# Patient Record
Sex: Male | Born: 1960
Health system: Southern US, Community
[De-identification: ages and names within clinical notes are randomized; demographics above are authoritative.]

## PROBLEM LIST (undated history)

## (undated) DIAGNOSIS — Z87442 Personal history of urinary calculi: Secondary | ICD-10-CM

## (undated) DIAGNOSIS — R112 Nausea with vomiting, unspecified: Secondary | ICD-10-CM

## (undated) DIAGNOSIS — E785 Hyperlipidemia, unspecified: Secondary | ICD-10-CM

## (undated) DIAGNOSIS — Z973 Presence of spectacles and contact lenses: Secondary | ICD-10-CM

## (undated) DIAGNOSIS — T7840XA Allergy, unspecified, initial encounter: Secondary | ICD-10-CM

## (undated) DIAGNOSIS — R3915 Urgency of urination: Secondary | ICD-10-CM

## (undated) DIAGNOSIS — I10 Essential (primary) hypertension: Secondary | ICD-10-CM

## (undated) DIAGNOSIS — N529 Male erectile dysfunction, unspecified: Secondary | ICD-10-CM

## (undated) DIAGNOSIS — N200 Calculus of kidney: Secondary | ICD-10-CM

## (undated) DIAGNOSIS — Z9889 Other specified postprocedural states: Secondary | ICD-10-CM

## (undated) DIAGNOSIS — Z8619 Personal history of other infectious and parasitic diseases: Secondary | ICD-10-CM

## (undated) HISTORY — PX: NEUROPLASTY / TRANSPOSITION ULNAR NERVE AT ELBOW: SUR895

## (undated) HISTORY — PX: POLYPECTOMY: SHX149

## (undated) HISTORY — DX: Essential (primary) hypertension: I10

## (undated) HISTORY — DX: Allergy, unspecified, initial encounter: T78.40XA

## (undated) HISTORY — PX: EXTRACORPOREAL SHOCK WAVE LITHOTRIPSY: SHX1557

---
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
## 2001-12-05 ENCOUNTER — Emergency Department (HOSPITAL_COMMUNITY): Admission: EM | Admit: 2001-12-05 | Discharge: 2001-12-05 | Payer: Self-pay | Admitting: Emergency Medicine

## 2009-05-19 ENCOUNTER — Emergency Department: Payer: Self-pay | Admitting: Emergency Medicine

## 2012-01-03 ENCOUNTER — Other Ambulatory Visit: Payer: Self-pay | Admitting: Urology

## 2012-01-08 ENCOUNTER — Encounter (HOSPITAL_COMMUNITY): Payer: Self-pay | Admitting: *Deleted

## 2012-01-08 NOTE — Pre-Procedure Instructions (Signed)
Asked to bring blue folder the day of the procedure,insurance card,I.D. driver's license,wear comfortable clothing and have a driver for the day. Asked not to take Advil,Motrin,Ibuprofen,Aleve or any NSAIDS, Aspirin, or Toradol for 72 hours prior to procedure,  No vitamins or herbal medications 7 days prior to procedure. Will stop Aspirin 72 hours before procedure. Instructed to take laxative per doctor's office instructions and eat a light dinner the evening before procedure.   To arrive at 0530 for lithotripsy procedure.

## 2012-01-17 NOTE — H&P (Signed)
History of Present Illness            [name] returns for annual follow-up.    He has been doing extremely well with no signs or symptoms of recurrent stone disease.  The patient is known to have a small stone in the lower pole of his left kidney.  On KUB 1 year ago the stone appears to be unchanged in size and location, measuring just about 3-4 mm.  He was on AndroGel through his primary care physician but has stopped that about 6 months ago with only mild changes.  He has had some decrease in energy, but did not think it was worth the cost. His last PSA was 1 year ago and normal at 1.0.  No voiding complaints.  His urine today is crystal clear. He does report a concerning growth on his penis.   Past Medical History Problems  1. History of  Hypercholesterolemia 272.0 2. History of  Hypertension 401.9  Surgical History Problems  1. History of  Leg Repair Left 2. History of  Neuroplasty With Transposition Of Ulnar Nerve 3. History of  Surgery Of Male Genitalia Vasectomy V25.2  Current Meds 1. AndroGel Pump 20.25 MG/ACT (1.62%) Transdermal Gel; Therapy: 19Apr2012 to 2. Aspirin 81 MG Oral Tablet Delayed Release; Therapy: (Recorded:08Jul2010) to 3. CoQ10 CAPS; Therapy: (Recorded:08Jul2010) to 4. Fish Oil CAPS; Therapy: (Recorded:08Jul2010) to 5. Levitra 20 MG Oral Tablet; Therapy: 13Jun2011 to 6. Meloxicam 15 MG Oral Tablet; Therapy: 02Nov2010 to 7. Simvastatin TABS; 40mg  QD; Therapy: (Recorded:08Jul2010) to  Allergies Medication  1. No Known Drug Allergies  Family History Problems  1. Family history of  Family Health Status - Father's Age 66yrs 2. Family history of  Family Health Status - Mother's Age 38yrs 3. Family history of  Family Health Status Number Of Children 1 son and 2 daughters 4. Family history of  Prostate Cancer V16.42  Social History Problems  1. Being A Social Drinker 2. Caffeine Use 3 qd 3. Marital History - Currently Married 4.  Occupation: Pharmacist, community 5. Tobacco Use V15.82 1/2 ppd for 12yrs, nonsmoker for the past 3 yrs, but does continue w/ smokeless  Review of Systems Genitourinary, constitutional, skin, eye, otolaryngeal, hematologic/lymphatic, cardiovascular, pulmonary, endocrine, musculoskeletal, gastrointestinal, neurological and psychiatric system(s) were reviewed and pertinent findings if present are noted.  Genitourinary: nocturia, erectile dysfunction and penile lesion, but no testicular pain.  Gastrointestinal: heartburn, but no flank pain and no abdominal pain.    Vitals Vital Signs [Data Includes: Last 1 Day]  17Oct2013 09:34AM  Blood Pressure: 133 / 78 Temperature: 98.2 F Heart Rate: 78  Physical Exam Constitutional: Well nourished and well developed . No acute distress.  Neck: The appearance of the neck is normal and no neck mass is present.  Pulmonary: No respiratory distress and normal respiratory rhythm and effort.  Cardiovascular: Heart rate and rhythm are normal . No peripheral edema.  Abdomen: The abdomen is soft and nontender. No masses are palpated. No CVA tenderness. No hernias are palpable. No hepatosplenomegaly noted.  Rectal: Rectal exam demonstrates normal sphincter tone, no tenderness and no masses. Estimated prostate size is 1+. Normal rectal tone, no rectal masses, prostate is smooth, symmetric and non-tender. The prostate has no nodularity and is not tender. The left seminal vesicle is nonpalpable. The right seminal vesicle is nonpalpable. The perineum is normal on inspection.  Genitourinary: The penis is circumcised. Penile lesion: A single 5 mm papule(s) noted.    Results/Data   On KUB today  the patient's left renal stone appears denser and larger and now measuring 4-5 mm. I saw no other suspicious calcifications.     Assessment Assessed  1. Nephrolithiasis 592.0 2. 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 Hydrocodone-Acetaminophen 5-500 MG  Oral Tablet; 1-2 TABS PO Q 4-6H PRN PAIN; Therapy:  17Oct2013 to (Last Rx:17Oct2013) 2. Follow-up Schedule Surgery Office  Follow-up  Requested for: 17Oct2013  Discussion/Summary      .    1. Nephrolithiasis. Finnlee has had a 3-4 mm stone in the mid pole of his left kidney that had been observed. He has had no significant flank pain or hematuria. On KUB today, there are 2 significant findings: 1. This stone has increased in size and is now measuring approximately 5 mm x 7 mm. More importantly, the stone currently appears to be located in the renal pelvis/UPJ region, as opposed to a middle calyx, where it was. There is really no indication for CT imaging, since a KUB does define this quite nicely. This stone at this point should be treated, given its location. Ultimate treatment would be ESWL. We went over that procedure, success rates. We will attempt to get him on the schedule for sometime in the next several weeks. I will provide him a prescription for some pain medication in case this does become symptomatic. If that is the case, then something may need to be done on a more urgent basis.  2. Penile lesion. On clinical exam this appears to be an isolated solitary condyloma. We recommended a cryo procedure, which was done in the office today. We performed 2 freeze cycles for approximately 40 seconds and Bacitracin ointment was applied. We will keep an eye on these lesions. Additional information about condyloma will be provided to the patient.

## 2012-01-20 ENCOUNTER — Ambulatory Visit (HOSPITAL_COMMUNITY)
Admission: RE | Admit: 2012-01-20 | Discharge: 2012-01-20 | Disposition: A | Discharge status: Home / Self Care | Payer: 59 | Source: Ambulatory Visit | Attending: Urology | Admitting: Urology

## 2012-01-20 ENCOUNTER — Encounter (HOSPITAL_COMMUNITY): Payer: Self-pay

## 2012-01-20 ENCOUNTER — Ambulatory Visit (HOSPITAL_COMMUNITY): Payer: 59

## 2012-01-20 ENCOUNTER — Encounter (HOSPITAL_COMMUNITY)
Admission: RE | Disposition: A | Discharge status: Home / Self Care | Payer: Self-pay | Source: Ambulatory Visit | Attending: Urology

## 2012-01-20 DIAGNOSIS — N200 Calculus of kidney: Secondary | ICD-10-CM | POA: Insufficient documentation

## 2012-01-20 DIAGNOSIS — Z79899 Other long term (current) drug therapy: Secondary | ICD-10-CM | POA: Insufficient documentation

## 2012-01-20 DIAGNOSIS — F17200 Nicotine dependence, unspecified, uncomplicated: Secondary | ICD-10-CM | POA: Insufficient documentation

## 2012-01-20 DIAGNOSIS — E7800 Pure hypercholesterolemia, unspecified: Secondary | ICD-10-CM | POA: Insufficient documentation

## 2012-01-20 DIAGNOSIS — I10 Essential (primary) hypertension: Secondary | ICD-10-CM | POA: Insufficient documentation

## 2012-01-20 DIAGNOSIS — Z7982 Long term (current) use of aspirin: Secondary | ICD-10-CM | POA: Insufficient documentation

## 2012-01-20 SURGERY — LITHOTRIPSY, ESWL
Anesthesia: LOCAL | Laterality: Left

## 2012-01-20 MED ORDER — DEXTROSE-NACL 5-0.45 % IV SOLN
INTRAVENOUS | Status: DC
  Administered 2012-01-20: 07:00:00 via INTRAVENOUS

## 2012-01-20 MED ORDER — DIPHENHYDRAMINE HCL 25 MG PO CAPS
25.0000 mg | ORAL_CAPSULE | ORAL | Status: AC
  Administered 2012-01-20: 25 mg via ORAL
  Filled 2012-01-20: qty 1

## 2012-01-20 MED ORDER — DIAZEPAM 5 MG PO TABS
10.0000 mg | ORAL_TABLET | ORAL | Status: AC
  Administered 2012-01-20: 10 mg via ORAL
  Filled 2012-01-20: qty 2

## 2012-01-20 MED ORDER — CIPROFLOXACIN IN D5W 400 MG/200ML IV SOLN
400.0000 mg | INTRAVENOUS | Status: AC
  Administered 2012-01-20: 400 mg via INTRAVENOUS
  Filled 2012-01-20: qty 200

## 2012-01-20 NOTE — Op Note (Signed)
See [facility] OP note scanned into chart. 

## 2012-01-20 NOTE — Interval H&P Note (Signed)
History and Physical Interval Note:  01/20/2012 7:49 AM  Gabriel Clark  has presented today for surgery, with the diagnosis of Left Renal Calculus, Ureteral Pelvic Junction Stone  The various methods of treatment have been discussed with the patient and family. After consideration of risks, benefits and other options for treatment, the patient has consented to  Procedure(s) (LRB) with comments: EXTRACORPOREAL SHOCK WAVE LITHOTRIPSY (ESWL) (Left) as a surgical intervention .  The patient's history has been reviewed, patient examined, no change in status, stable for surgery.  I have reviewed the patient's chart and labs.  Questions were answered to the patient's satisfaction.     [name]

## 2013-12-17 ENCOUNTER — Other Ambulatory Visit: Payer: Self-pay | Admitting: Urology

## 2013-12-17 ENCOUNTER — Encounter (HOSPITAL_COMMUNITY): Payer: Self-pay | Admitting: *Deleted

## 2013-12-19 NOTE — H&P (Signed)
History of Present Illness [name] who is a patient of Dr. Isabel CapriceGrapey. He has a history of urolithiasis and presents today with a four-day history of severe left-sided flank pain and left upper quadrant pain. He has had significant nausea and vomiting but denies any objective fever. He denies any gross hematuria. Last fall, he did have a CT scan which confirmed a 5 mm left renal calculus along with other smaller left renal calculi. His last imaging was a KUB x-ray last December which demonstrated his left renal stone to be in stable position within the lower pole.   Past Medical History Problems  1. History of hypercholesterolemia (V12.29) 2. History of hypertension (V12.59)  Surgical History Problems  1. History of Leg Repair 2. History of Lithotripsy 3. History of Neuroplasty With Transposition Of Ulnar Nerve 4. History of Surgery Of Male Genitalia Vasectomy  Current Meds 1. Aspirin 81 MG Oral Tablet Delayed Release;  Therapy: (Recorded:08Jul2010) to Recorded 2. CoQ10 CAPS;  Therapy: (Recorded:08Jul2010) to Recorded 3. Fish Oil CAPS;  Therapy: (Recorded:08Jul2010) to Recorded 4. Levitra 20 MG Oral Tablet;  Therapy: 13Jun2011 to Recorded 5. Multi-Day Vitamins TABS;  Therapy: (Recorded:22Sep2014) to Recorded 6. Oxycodone-Acetaminophen 5-325 MG Oral Tablet;  Therapy: 04Nov2013 to Recorded 7. Simvastatin TABS; 40mg  QD;  Therapy: (Recorded:08Jul2010) to Recorded  Allergies Medication  1. No Known Drug Allergies  Family History Problems  1. Family history of Family Health Status Number Of Children   1 son and 2 daughters 2. FH: prostate cancer (V16.42)  Social History Problems  1. Being A Social Drinker 2. Current every day smoker (305.1) 3. Marital History - Currently Married 4. Occupation:   Pharmacist, communityproduction planner 5. Tobacco Use (V15.82)   1/2 ppd for 3346yrs, nonsmoker for the past 3 yrs, but does continue w/ smokeless  Review of  Systems  Genitourinary: no hematuria.  Gastrointestinal: nausea and vomiting.  Constitutional: no fever.    Vitals Vital Signs [Data Includes: Last 1 Day]  Recorded: 30Sep2015 10:43AM  Blood Pressure: 115 / 72 Temperature: 97.4 F Heart Rate: 71  Physical Exam Constitutional: Well nourished and well developed . No acute distress.  ENT:. The ears and nose are normal in appearance.  Neck: The appearance of the neck is normal and no neck mass is present.  Pulmonary: No respiratory distress, normal respiratory rhythm and effort and clear bilateral breath sounds.  Cardiovascular: Heart rate and rhythm are normal . No peripheral edema.  Abdomen: Moderate tenderness in the LUQ is present. moderate left CVA tenderness.  Skin: Normal skin turgor, no visible rash and no visible skin lesions.  Neuro/Psych:. Mood and affect are appropriate.    Results/Data Urine [Data Includes: Last 1 Day]   30Sep2015  COLOR AMBER   APPEARANCE CLEAR   SPECIFIC GRAVITY 1.025   pH 5.5   GLUCOSE NEG mg/dL  BILIRUBIN NEG   KETONE NEG mg/dL  BLOOD TRACE   PROTEIN 100 mg/dL  UROBILINOGEN 0.2 mg/dL  NITRITE NEG   LEUKOCYTE ESTERASE NEG   SQUAMOUS EPITHELIAL/HPF RARE   WBC 0-2 WBC/hpf  RBC 0-2 RBC/hpf  BACTERIA NONE SEEN   CRYSTALS NONE SEEN   CASTS NONE SEEN    A KUB x-ray was performed. I independently reviewed his KUB. This demonstrates a 5-6 mm calcification in the proximal left ureter consistent with his previously noted left renal calculus.   Assessment Assessed  1. Ureteral stone (592.1)  Plan Health Maintenance  1. UA With REFLEX; [Do Not Release]; Status:Complete;  Done: 30Sep2015 10:34AM Nephrolithiasis  2. KUB; Status:Resulted - Requires Verification;   Done: 30Sep2015 12:00AM Ureteral stone  3. Start: Ondansetron 4 MG Oral Tablet Dispersible; TAKE 4 MG Every 8 hours PRN nausea 4. Start: Oxycodone-Acetaminophen 5-325 MG Oral Tablet; Take 1 - 2 po q 4-6hours prn  pain 5. Start:  Tamsulosin HCl - 0.4 MG Oral Capsule; TAKE 1 CAPSULE AT BEDTIME 6. Follow-up Office  Follow-up - will call to schedule surgery  Status: Hold For - Date of  Service  Requested for: 30Sep2015  Discussion/Summary 1. Proximal left ureteral calculus: His symptoms do appear to be consistent with a proximal left ureteral calculus based on his KUB images today. I have reviewed options with him including a trial of medical expulsion therapy versus definitive treatment. Considering his pain symptoms, he would like to pursue with scheduling surgery in the near future and in the meantime make an attempt to try to pass a stone. He was provided a prescription for oxycodone/acetaminophen, tamsulosin, and Zofran. He understands that we would need to proceed with more urgent therapy should he develop fever, uncontrolled pain, or persistent nausea and vomiting that cannot be well controlled. I will plan to discuss his situation with Dr. Isabel CapriceGrapey. Dr. Isabel CapriceGrapey is unable to treat him in the near future, I told him that I do likely have some time next Monday and could treat him at that point. After reviewing treatment options including the pros and cons of shockwave lithotripsy and ureteroscopic laser lithotripsy, he does adamantly wish to proceed with left ureteroscopic laser lithotripsy for treatment. He has previously undergone shockwave lithotripsy and does wish to proceed with more definitive therapy at this time. We reviewed the potential risks, complications, and expected recovery process associated with this procedure. He gives his informed consent to proceed and will be scheduled appropriately.    Cc: Dr. Barron Alvineavid Grapey     Signatures Electronically signed by : Heloise PurpuraLester Emanuelle Bastos, M.D.; Dec 15 2013 12:40PM EST

## 2013-12-20 ENCOUNTER — Encounter (HOSPITAL_COMMUNITY): Payer: 59 | Admitting: Anesthesiology

## 2013-12-20 ENCOUNTER — Encounter (HOSPITAL_COMMUNITY)
Admission: RE | Disposition: A | Discharge status: Home / Self Care | Payer: Self-pay | Source: Ambulatory Visit | Attending: Urology

## 2013-12-20 ENCOUNTER — Ambulatory Visit (HOSPITAL_COMMUNITY): Payer: 59 | Admitting: Anesthesiology

## 2013-12-20 ENCOUNTER — Ambulatory Visit (HOSPITAL_COMMUNITY)
Admission: RE | Admit: 2013-12-20 | Discharge: 2013-12-20 | Disposition: A | Discharge status: Home / Self Care | Payer: 59 | Source: Ambulatory Visit | Attending: Urology | Admitting: Urology

## 2013-12-20 ENCOUNTER — Encounter (HOSPITAL_COMMUNITY): Payer: Self-pay | Admitting: *Deleted

## 2013-12-20 ENCOUNTER — Ambulatory Visit (HOSPITAL_COMMUNITY): Payer: 59

## 2013-12-20 DIAGNOSIS — N201 Calculus of ureter: Secondary | ICD-10-CM | POA: Insufficient documentation

## 2013-12-20 DIAGNOSIS — E780 Pure hypercholesterolemia: Secondary | ICD-10-CM | POA: Diagnosis not present

## 2013-12-20 DIAGNOSIS — F17210 Nicotine dependence, cigarettes, uncomplicated: Secondary | ICD-10-CM | POA: Diagnosis not present

## 2013-12-20 DIAGNOSIS — I10 Essential (primary) hypertension: Secondary | ICD-10-CM | POA: Insufficient documentation

## 2013-12-20 HISTORY — PX: CYSTOSCOPY WITH RETROGRADE PYELOGRAM, URETEROSCOPY AND STENT PLACEMENT: SHX5789

## 2013-12-20 LAB — CBC
HEMATOCRIT: 35.1 % — AB (ref 39.0–52.0)
Hemoglobin: 12.5 g/dL — ABNORMAL LOW (ref 13.0–17.0)
MCH: 32.6 pg (ref 26.0–34.0)
MCHC: 35.6 g/dL (ref 30.0–36.0)
MCV: 91.4 fL (ref 78.0–100.0)
PLATELETS: 192 10*3/uL (ref 150–400)
RBC: 3.84 MIL/uL — AB (ref 4.22–5.81)
RDW: 11.3 % — AB (ref 11.5–15.5)
WBC: 7.7 10*3/uL (ref 4.0–10.5)

## 2013-12-20 LAB — BASIC METABOLIC PANEL
ANION GAP: 13 (ref 5–15)
BUN: 25 mg/dL — AB (ref 6–23)
CHLORIDE: 98 meq/L (ref 96–112)
CO2: 26 mEq/L (ref 19–32)
Calcium: 8.9 mg/dL (ref 8.4–10.5)
Creatinine, Ser: 1.87 mg/dL — ABNORMAL HIGH (ref 0.50–1.35)
GFR calc non Af Amer: 39 mL/min — ABNORMAL LOW (ref >90)
GFR, EST AFRICAN AMERICAN: 45 mL/min — AB (ref >90)
Glucose, Bld: 104 mg/dL — ABNORMAL HIGH (ref 70–99)
POTASSIUM: 4.2 meq/L (ref 3.7–5.3)
Sodium: 137 mEq/L (ref 137–147)

## 2013-12-20 SURGERY — CYSTOURETEROSCOPY, WITH RETROGRADE PYELOGRAM AND STENT INSERTION
Anesthesia: General | Laterality: Left

## 2013-12-20 MED ORDER — SODIUM CHLORIDE 0.9 % IJ SOLN
INTRAMUSCULAR | Status: AC
  Filled 2013-12-20: qty 10

## 2013-12-20 MED ORDER — PROPOFOL 10 MG/ML IV BOLUS
INTRAVENOUS | Status: DC | PRN
  Administered 2013-12-20: 200 mg via INTRAVENOUS

## 2013-12-20 MED ORDER — ONDANSETRON HCL 4 MG/2ML IJ SOLN
INTRAMUSCULAR | Status: DC | PRN
  Administered 2013-12-20: 4 mg via INTRAVENOUS

## 2013-12-20 MED ORDER — CIPROFLOXACIN IN D5W 400 MG/200ML IV SOLN
400.0000 mg | INTRAVENOUS | Status: AC
  Administered 2013-12-20: 400 mg via INTRAVENOUS

## 2013-12-20 MED ORDER — MIDAZOLAM HCL 2 MG/2ML IJ SOLN
INTRAMUSCULAR | Status: AC
  Filled 2013-12-20: qty 2

## 2013-12-20 MED ORDER — FENTANYL CITRATE 0.05 MG/ML IJ SOLN
INTRAMUSCULAR | Status: DC | PRN
  Administered 2013-12-20 (×2): 50 ug via INTRAVENOUS

## 2013-12-20 MED ORDER — TAMSULOSIN HCL 0.4 MG PO CAPS: 0.4000 mg | ORAL_CAPSULE | Freq: Every day | ORAL | Status: DC

## 2013-12-20 MED ORDER — SODIUM CHLORIDE 0.9 % IV SOLN
INTRAVENOUS | Status: DC
  Administered 2013-12-20: 1000 mL via INTRAVENOUS

## 2013-12-20 MED ORDER — LIDOCAINE HCL 2 % EX GEL
CUTANEOUS | Status: AC
  Filled 2013-12-20: qty 10

## 2013-12-20 MED ORDER — MIDAZOLAM HCL 5 MG/5ML IJ SOLN
INTRAMUSCULAR | Status: DC | PRN
  Administered 2013-12-20: 1 mg via INTRAVENOUS

## 2013-12-20 MED ORDER — LIDOCAINE HCL 2 % EX GEL
CUTANEOUS | Status: DC | PRN
  Administered 2013-12-20: 1 via URETHRAL

## 2013-12-20 MED ORDER — LACTATED RINGERS IV SOLN: INTRAVENOUS | Status: DC

## 2013-12-20 MED ORDER — SODIUM CHLORIDE 0.9 % IR SOLN
Status: DC | PRN
  Administered 2013-12-20: 4000 mL

## 2013-12-20 MED ORDER — ONDANSETRON HCL 4 MG/2ML IJ SOLN
INTRAMUSCULAR | Status: AC
  Filled 2013-12-20: qty 2

## 2013-12-20 MED ORDER — LIDOCAINE HCL 1 % IJ SOLN
INTRAMUSCULAR | Status: DC | PRN
  Administered 2013-12-20: 40 mg via INTRADERMAL

## 2013-12-20 MED ORDER — FENTANYL CITRATE 0.05 MG/ML IJ SOLN
25.0000 ug | INTRAMUSCULAR | Status: DC | PRN
  Administered 2013-12-20 (×2): 50 ug via INTRAVENOUS

## 2013-12-20 MED ORDER — CIPROFLOXACIN HCL 500 MG PO TABS: 500.0000 mg | ORAL_TABLET | Freq: Two times a day (BID) | ORAL | Status: DC

## 2013-12-20 MED ORDER — LIDOCAINE HCL (CARDIAC) 20 MG/ML IV SOLN
INTRAVENOUS | Status: AC
  Filled 2013-12-20: qty 5

## 2013-12-20 MED ORDER — FENTANYL CITRATE 0.05 MG/ML IJ SOLN
INTRAMUSCULAR | Status: AC
  Filled 2013-12-20: qty 5

## 2013-12-20 MED ORDER — CIPROFLOXACIN IN D5W 400 MG/200ML IV SOLN
INTRAVENOUS | Status: AC
  Filled 2013-12-20: qty 200

## 2013-12-20 MED ORDER — FENTANYL CITRATE 0.05 MG/ML IJ SOLN
INTRAMUSCULAR | Status: AC
  Filled 2013-12-20: qty 2

## 2013-12-20 MED ORDER — HYDROCODONE-ACETAMINOPHEN 5-325 MG PO TABS: 1.0000 | ORAL_TABLET | Freq: Four times a day (QID) | ORAL | Status: DC | PRN

## 2013-12-20 MED ORDER — 0.9 % SODIUM CHLORIDE (POUR BTL) OPTIME
TOPICAL | Status: DC | PRN
  Administered 2013-12-20: 1000 mL

## 2013-12-20 MED ORDER — IOHEXOL 300 MG/ML  SOLN
INTRAMUSCULAR | Status: DC | PRN
  Administered 2013-12-20: 10 mL

## 2013-12-20 MED ORDER — PROPOFOL 10 MG/ML IV BOLUS
INTRAVENOUS | Status: AC
  Filled 2013-12-20: qty 20

## 2013-12-20 SURGICAL SUPPLY — 20 items
BAG URO CATCHER STRL LF (DRAPE) ×3 IMPLANT
BASKET ZERO TIP NITINOL 2.4FR (BASKET) IMPLANT
CATH INTERMIT  6FR 70CM (CATHETERS) ×3 IMPLANT
CLOTH BEACON ORANGE TIMEOUT ST (SAFETY) ×3 IMPLANT
DRAPE CAMERA CLOSED 9X96 (DRAPES) ×3 IMPLANT
FIBER LASER FLEXIVA 1000 (UROLOGICAL SUPPLIES) IMPLANT
FIBER LASER FLEXIVA 200 (UROLOGICAL SUPPLIES) IMPLANT
FIBER LASER FLEXIVA 365 (UROLOGICAL SUPPLIES) IMPLANT
FIBER LASER FLEXIVA 550 (UROLOGICAL SUPPLIES) IMPLANT
FIBER LASER TRAC TIP (UROLOGICAL SUPPLIES) IMPLANT
GLOVE BIOGEL M STRL SZ7.5 (GLOVE) ×3 IMPLANT
GOWN STRL REUS W/TWL LRG LVL3 (GOWN DISPOSABLE) ×3 IMPLANT
GUIDEWIRE ANG ZIPWIRE 038X150 (WIRE) IMPLANT
GUIDEWIRE STR DUAL SENSOR (WIRE) ×6 IMPLANT
MANIFOLD NEPTUNE II (INSTRUMENTS) ×3 IMPLANT
PACK CYSTO (CUSTOM PROCEDURE TRAY) ×3 IMPLANT
SHEATH ACCESS URETERAL 38CM (SHEATH) ×3 IMPLANT
STENT CONTOUR 6FRX26X.038 (STENTS) ×3 IMPLANT
TUBING CONNECTING 10 (TUBING) ×2 IMPLANT
TUBING CONNECTING 10' (TUBING) ×1

## 2013-12-20 NOTE — Op Note (Signed)
Preoperative diagnosis: Left proximal ureteral calculus  Postoperative diagnosis: Left proximla ureteral calculus  Procedure:  1. Cystoscopy 2. Left ureteroscopy 3. Left ureteral stent placement (6 x 26 - no string) 4. Left retrograde pyelography with interpretation  Surgeon: Pryor Curia. M.D.  Anesthesia: General  Complications: None  Intraoperative findings: Left retrograde pyelography demonstrated a filling defect within the proximal left ureter consistent with the patient's known calculus without other abnormalities.  EBL: Minimal  Indication: Gabriel Clark is a 53 y.o. year old patient with urolithiasis and a known 5-6 mm left ureteral stone. After reviewing the management options for treatment, the patient elected to proceed with the above surgical procedure(s). We have discussed the potential benefits and risks of the procedure, side effects of the proposed treatment, the likelihood of the patient achieving the goals of the procedure, and any potential problems that might occur during the procedure or recuperation. Informed consent has been obtained.  Description of procedure:  The patient was taken to the operating room and general anesthesia was induced.  The patient was placed in the dorsal lithotomy position, prepped and draped in the usual sterile fashion, and preoperative antibiotics were administered. A preoperative time-out was performed.   Cystourethroscopy was performed.  The patient's urethra was examined and was normal. The bladder was then systematically examined in its entirety. There was no evidence for any bladder tumors, stones, or other mucosal pathology.    Attention then turned to the left ureteral orifice and a ureteral catheter was used to intubate the ureteral orifice.  Omnipaque contrast was injected through the ureteral catheter and a retrograde pyelogram was performed with findings as dictated above.  A 0.38 sensor guidewire was then advanced  up the left ureter into the renal pelvis under fluoroscopic guidance. The 6 Fr semirigid ureteroscope was then advanced into the ureter next to the guidewire and the entire ureter was examined.  There were multiple areas that were tortuous and required a 2nd wire to navigate the ureter.  The stone appeared to have migrated proximally above the level that was able to visualized with the semirigid scope.  This scope was therefore removed and an attempt was made to place a 12/14 ureteral access sheath.  However, the access sheath was unable to be safely placed up the ureter with resistance met in the distal mid ureter.  An attempt to then place the inner sheath alone for dilation was also unsuccessful.  It was therefore decided that the safest course of action would be to place a ureteral stent to allow for passive dilation of the ureter.  The wire was then backloaded through the cystoscope and a ureteral stent was advance over the wire using Seldinger technique.  The stent was positioned appropriately under fluoroscopic and cystoscopic guidance.  The wire was then removed with an adequate stent curl noted in the renal pelvis as well as in the bladder.  The bladder was then emptied and the procedure ended.  The patient appeared to tolerate the procedure well and without complications.  The patient was able to be awakened and transferred to the recovery unit in satisfactory condition.

## 2013-12-20 NOTE — Discharge Instructions (Signed)

## 2013-12-20 NOTE — Transfer of Care (Signed)
Immediate Anesthesia Transfer of Care Note  Patient: Gabriel Clark  Procedure(s) Performed: Procedure(s): CYSTOSCOPY WITH RETROGRADE PYELOGRAM, URETEROSCOPY AND STENT PLACEMENT (Left)  Patient Location: PACU  Anesthesia Type:General  Level of Consciousness: awake, alert  and oriented  Airway & Oxygen Therapy: Patient Spontanous Breathing and Patient connected to face mask oxygen  Post-op Assessment: Report given to PACU RN and Post -op Vital signs reviewed and stable  Post vital signs: Reviewed and stable  Complications: No apparent anesthesia complications

## 2013-12-20 NOTE — Anesthesia Preprocedure Evaluation (Addendum)
Anesthesia Evaluation  Patient identified by MRN, date of birth, ID band Patient awake    Reviewed: Allergy & Precautions, H&P , NPO status , Patient's Chart, lab work & pertinent test results, reviewed documented beta blocker date and time   Airway Mallampati: II TM Distance: >3 FB Neck ROM: full    Dental no notable dental hx. (+) Teeth Intact, Dental Advisory Given   Pulmonary neg pulmonary ROS, Current Smoker,  breath sounds clear to auscultation  Pulmonary exam normal       Cardiovascular Exercise Tolerance: Good negative cardio ROS  Rhythm:regular Rate:Normal     Neuro/Psych negative neurological ROS  negative psych ROS   GI/Hepatic negative GI ROS, Neg liver ROS,   Endo/Other  negative endocrine ROS  Renal/GU negative Renal ROS  negative genitourinary   Musculoskeletal   Abdominal   Peds  Hematology negative hematology ROS (+)   Anesthesia Other Findings   Reproductive/Obstetrics negative OB ROS                          Anesthesia Physical Anesthesia Plan  ASA: II  Anesthesia Plan: General   Post-op Pain Management:    Induction: Intravenous  Airway Management Planned: LMA  Additional Equipment:   Intra-op Plan:   Post-operative Plan:   Informed Consent: I have reviewed the patients History and Physical, chart, labs and discussed the procedure including the risks, benefits and alternatives for the proposed anesthesia with the patient or authorized representative who has indicated his/her understanding and acceptance.   Dental Advisory Given  Plan Discussed with: CRNA and Surgeon  Anesthesia Plan Comments:         Anesthesia Quick Evaluation

## 2013-12-20 NOTE — Interval H&P Note (Signed)
History and Physical Interval Note:  12/20/2013 1:30 PM  Gabriel Clark  has presented today for surgery, with the diagnosis of LEFT URETERAL CALCULUS  The various methods of treatment have been discussed with the patient and family. After consideration of risks, benefits and other options for treatment, the patient has consented to  Procedure(s): CYSTOSCOPY WITH RETROGRADE PYELOGRAM, URETEROSCOPY AND STENT PLACEMENT (Left) HOLMIUM LASER APPLICATION (Left) as a surgical intervention .  The patient's history has been reviewed, patient examined, no change in status, stable for surgery.  I have reviewed the patient's chart and labs.  Questions were answered to the patient's satisfaction.     Rashae Rother,LES

## 2013-12-20 NOTE — Anesthesia Postprocedure Evaluation (Signed)
  Anesthesia Post-op Note  Patient: Gabriel Clark  Procedure(s) Performed: Procedure(s) (LRB): CYSTOSCOPY WITH RETROGRADE PYELOGRAM, URETEROSCOPY AND STENT PLACEMENT (Left)  Patient Location: PACU  Anesthesia Type: General  Level of Consciousness: awake and alert   Airway and Oxygen Therapy: Patient Spontanous Breathing  Post-op Pain: mild  Post-op Assessment: Post-op Vital signs reviewed, Patient's Cardiovascular Status Stable, Respiratory Function Stable, Patent Airway and No signs of Nausea or vomiting  Last Vitals:  Filed Vitals:   12/20/13 1539  BP: 112/68  Pulse: 57  Temp: 36.6 C  Resp: 12    Post-op Vital Signs: stable   Complications: No apparent anesthesia complications

## 2013-12-21 ENCOUNTER — Encounter (HOSPITAL_COMMUNITY): Payer: Self-pay | Admitting: Urology

## 2013-12-21 ENCOUNTER — Other Ambulatory Visit: Payer: Self-pay | Admitting: Urology

## 2014-01-03 ENCOUNTER — Encounter (HOSPITAL_COMMUNITY): Payer: Self-pay | Admitting: Pharmacy Technician

## 2014-01-03 ENCOUNTER — Encounter (HOSPITAL_COMMUNITY)
Admission: RE | Admit: 2014-01-03 | Discharge: 2014-01-03 | Disposition: A | Discharge status: Home / Self Care | Payer: 59 | Source: Ambulatory Visit | Attending: Urology | Admitting: Urology

## 2014-01-03 ENCOUNTER — Encounter (HOSPITAL_COMMUNITY): Payer: Self-pay

## 2014-01-03 DIAGNOSIS — E780 Pure hypercholesterolemia: Secondary | ICD-10-CM | POA: Diagnosis not present

## 2014-01-03 DIAGNOSIS — F17210 Nicotine dependence, cigarettes, uncomplicated: Secondary | ICD-10-CM | POA: Diagnosis not present

## 2014-01-03 DIAGNOSIS — I10 Essential (primary) hypertension: Secondary | ICD-10-CM | POA: Diagnosis not present

## 2014-01-03 DIAGNOSIS — N202 Calculus of kidney with calculus of ureter: Secondary | ICD-10-CM | POA: Diagnosis not present

## 2014-01-03 HISTORY — DX: Personal history of urinary calculi: Z87.442

## 2014-01-03 HISTORY — DX: Other specified postprocedural states: R11.2

## 2014-01-03 HISTORY — DX: Other specified postprocedural states: Z98.890

## 2014-01-03 LAB — BASIC METABOLIC PANEL
ANION GAP: 9 (ref 5–15)
BUN: 24 mg/dL — ABNORMAL HIGH (ref 6–23)
CO2: 27 mEq/L (ref 19–32)
Calcium: 9.3 mg/dL (ref 8.4–10.5)
Chloride: 103 mEq/L (ref 96–112)
Creatinine, Ser: 1.06 mg/dL (ref 0.50–1.35)
GFR calc Af Amer: >90 mL/min (ref >90)
GFR, EST NON AFRICAN AMERICAN: 78 mL/min — AB (ref >90)
Glucose, Bld: 107 mg/dL — ABNORMAL HIGH (ref 70–99)
Potassium: 5 mEq/L (ref 3.7–5.3)
SODIUM: 139 meq/L (ref 137–147)

## 2014-01-03 LAB — CBC
HCT: 39.4 % (ref 39.0–52.0)
Hemoglobin: 13.8 g/dL (ref 13.0–17.0)
MCH: 33.6 pg (ref 26.0–34.0)
MCHC: 35 g/dL (ref 30.0–36.0)
MCV: 95.9 fL (ref 78.0–100.0)
PLATELETS: 193 10*3/uL (ref 150–400)
RBC: 4.11 MIL/uL — AB (ref 4.22–5.81)
RDW: 11.9 % (ref 11.5–15.5)
WBC: 5.9 10*3/uL (ref 4.0–10.5)

## 2014-01-03 NOTE — Patient Instructions (Addendum)
[facility]  ON:  Thursday  10/22  REPORT TO  SHORT STAY CENTER AT:  10:30 AM     DO NOT EAT  ANYTHING AFTER MIDNIGHT THE NIGHT BEFORE YOUR SURGERY.   NO FOOD, NO CHEWING GUM, NO MINTS, NO CANDIES, NO CHEWING TOBACCO. YOU MAY HAVE CLEAR LIQUIDS TO DRINK FROM MIDNIGHT THE NIGHT BEFORE SURGERY - UNTIL 6:30 AM DAY OF SURGERY - LIKE WATER, COFFEE ( JUST NO MILK OR CREAM ).   NOTHING TO DRINK AFTER 6:30 AM DAY OF YOUR SURGERY.  PLEASE TAKE THE FOLLOWING MEDICATIONS THE AM OF YOUR SURGERY WITH A FEW SIPS OF WATER:  NO MEDS TO TAKE - UNLESS YOU NEED A PAIN PILL.  .  DO NOT BRING VALUABLES, MONEY, CREDIT CARDS.  DO NOT WEAR JEWELRY, MAKE-UP, NAIL POLISH AND NO METAL PINS OR CLIPS IN YOUR HAIR. CONTACT LENS, DENTURES / PARTIALS, GLASSES SHOULD NOT BE WORN TO SURGERY AND IN MOST CASES-HEARING AIDS WILL NEED TO BE REMOVED.  BRING YOUR GLASSES CASE, ANY EQUIPMENT NEEDED FOR YOUR CONTACT LENS. FOR PATIENTS ADMITTED TO THE HOSPITAL--CHECK OUT TIME THE DAY OF DISCHARGE IS 11:00 AM.  ALL INPATIENT ROOMS ARE PRIVATE - WITH BATHROOM, TELEPHONE, TELEVISION AND WIFI INTERNET.  IF YOU ARE BEING DISCHARGED THE SAME DAY OF YOUR SURGERY--YOU CAN NOT DRIVE YOURSELF HOME--AND SHOULD NOT GO HOME ALONE BY TAXI OR BUS.  NO DRIVING OR OPERATING MACHINERY, OR MAKING LEGAL DECISIONS FOR 24 HOURS FOLLOWING ANESTHESIA / PAIN MEDICATIONS.  PLEASE MAKE ARRANGEMENTS FOR SOMEONE TO BE WITH YOU AT HOME THE FIRST 24 HOURS AFTER SURGERY. RESPONSIBLE DRIVER'S NAME / PHONE  PT'S WIFE WILL BE WITH HIM                                                     PLEASE BE AWARE THAT YOU MAY NEED ADDITIONAL BLOOD DRAWN DAY OF YOUR SURGERY  _______________________________________________________________________   [facility] Before surgery, you can play an important role.  Because skin is not sterile, your skin needs to be as free of germs as possible.  You can reduce the  number of germs on your skin by washing with CHG (chlorahexidine gluconate) soap before surgery.  CHG is an antiseptic cleaner which kills germs and bonds with the skin to continue killing germs even after washing. Please DO NOT use if you have an allergy to CHG or antibacterial soaps.  If your skin becomes reddened/irritated stop using the CHG and inform your nurse when you arrive at Short Stay. Do not shave (including legs and underarms) for at least 48 hours prior to the first CHG shower.  You may shave your face/neck. Please follow these instructions carefully:  1.  Shower with CHG Soap the night before surgery and the  morning of Surgery.  2.  If you choose to wash your hair, wash your hair first as usual with your  normal  shampoo.  3.  After you shampoo, rinse your hair and body thoroughly to remove the  shampoo.                           4.  Use CHG as you would any other liquid soap.  You can apply chg directly  to the  skin and wash                       Gently with a scrungie or clean washcloth.  5.  Apply the CHG Soap to your body ONLY FROM THE NECK DOWN.   Do not use on face/ open                           Wound or open sores. Avoid contact with eyes, ears mouth and genitals (private parts).                       Wash face,  Genitals (private parts) with your normal soap.             6.  Wash thoroughly, paying special attention to the area where your surgery  will be performed.  7.  Thoroughly rinse your body with warm water from the neck down.  8.  DO NOT shower/wash with your normal soap after using and rinsing off  the CHG Soap.                9.  Pat yourself dry with a clean towel.            10.  Wear clean pajamas.            11.  Place clean sheets on your bed the night of your first shower and do not  sleep with pets. Day of Surgery : Do not apply any lotions/deodorants the morning of surgery.  Please wear clean clothes to the hospital/surgery center.  FAILURE TO FOLLOW  THESE INSTRUCTIONS MAY RESULT IN THE CANCELLATION OF YOUR SURGERY PATIENT SIGNATURE_________________________________  NURSE SIGNATURE__________________________________  ________________________________________________________________________

## 2014-01-03 NOTE — Pre-Procedure Instructions (Signed)
EKG AND CXR REPORTS ARE IN EPIC FROM 12/20/13.

## 2014-01-05 NOTE — H&P (Signed)
History of Present Illness Gabriel Clark is a 53 year old gentleman who is a patient of Dr. Isabel CapriceGrapey. He has a history of urolithiasis and presents today with a four-day history of severe left-sided flank pain and left upper quadrant pain. He has had significant nausea and vomiting but denies any objective fever. He denies any gross hematuria. Last fall, he did have a CT scan which confirmed a 5 mm left renal calculus along with other smaller left renal calculi. His last imaging was a KUB x-ray last December which demonstrated his left renal stone to be in stable position within the lower pole.   Past Medical History Problems  1. History of hypercholesterolemia (V12.29) 2. History of hypertension (V12.59)  Surgical History Problems  1. History of Leg Repair 2. History of Lithotripsy 3. History of Neuroplasty With Transposition Of Ulnar Nerve 4. History of Surgery Of Male Genitalia Vasectomy  Current Meds 1. Aspirin 81 MG Oral Tablet Delayed Release;  Therapy: (Recorded:08Jul2010) to Recorded 2. CoQ10 CAPS;  Therapy: (Recorded:08Jul2010) to Recorded 3. Fish Oil CAPS;  Therapy: (Recorded:08Jul2010) to Recorded 4. Levitra 20 MG Oral Tablet;  Therapy: 13Jun2011 to Recorded 5. Multi-Day Vitamins TABS;  Therapy: (Recorded:22Sep2014) to Recorded 6. Oxycodone-Acetaminophen 5-325 MG Oral Tablet;  Therapy: 04Nov2013 to Recorded 7. Simvastatin TABS; 40mg  QD;  Therapy: (Recorded:08Jul2010) to Recorded  Allergies Medication  1. No Known Drug Allergies  Family History Problems  1. Family history of Family Health Status Number Of Children   1 son and 2 daughters 2. FH: prostate cancer (V16.42)  Social History Problems  1. Being A Social Drinker 2. Current every day smoker (305.1) 3. Marital History - Currently Married 4. Occupation:   Pharmacist, communityproduction planner 5. Tobacco Use (V15.82)   1/2 ppd for 3346yrs, nonsmoker for the past 3 yrs, but does continue w/ smokeless  Review of  Systems  Genitourinary: no hematuria.  Gastrointestinal: nausea and vomiting.  Constitutional: no fever.    Vitals Vital Signs [Data Includes: Last 1 Day]  Recorded: 30Sep2015 10:43AM  Blood Pressure: 115 / 72 Temperature: 97.4 F Heart Rate: 71  Physical Exam Constitutional: Well nourished and well developed . No acute distress.  ENT:. The ears and nose are normal in appearance.  Neck: The appearance of the neck is normal and no neck mass is present.  Pulmonary: No respiratory distress, normal respiratory rhythm and effort and clear bilateral breath sounds.  Cardiovascular: Heart rate and rhythm are normal . No peripheral edema.  Abdomen: Moderate tenderness in the LUQ is present. moderate left CVA tenderness.  Skin: Normal skin turgor, no visible rash and no visible skin lesions.  Neuro/Psych:. Mood and affect are appropriate.    Results/Data Urine [Data Includes: Last 1 Day]   30Sep2015  COLOR AMBER   APPEARANCE CLEAR   SPECIFIC GRAVITY 1.025   pH 5.5   GLUCOSE NEG mg/dL  BILIRUBIN NEG   KETONE NEG mg/dL  BLOOD TRACE   PROTEIN 100 mg/dL  UROBILINOGEN 0.2 mg/dL  NITRITE NEG   LEUKOCYTE ESTERASE NEG   SQUAMOUS EPITHELIAL/HPF RARE   WBC 0-2 WBC/hpf  RBC 0-2 RBC/hpf  BACTERIA NONE SEEN   CRYSTALS NONE SEEN   CASTS NONE SEEN    A KUB x-ray was performed. I independently reviewed his KUB. This demonstrates a 5-6 mm calcification in the proximal left ureter consistent with his previously noted left renal calculus.   Assessment Assessed  1. Ureteral stone (592.1)  Plan Health Maintenance  1. UA With REFLEX; [Do Not Release]; Status:Complete;  Done: 30Sep2015 10:34AM Nephrolithiasis  2. KUB; Status:Resulted - Requires Verification;   Done: 30Sep2015 12:00AM Ureteral stone  3. Start: Ondansetron 4 MG Oral Tablet Dispersible; TAKE 4 MG Every 8 hours PRN nausea 4. Start: Oxycodone-Acetaminophen 5-325 MG Oral Tablet; Take 1 - 2 po q 4-6hours prn  pain 5. Start:  Tamsulosin HCl - 0.4 MG Oral Capsule; TAKE 1 CAPSULE AT BEDTIME 6. Follow-up Office  Follow-up - will call to schedule surgery  Status: Hold For - Date of  Service  Requested for: 30Sep2015  Discussion/Summary 1. Proximal left ureteral calculus: His symptoms do appear to be consistent with a proximal left ureteral calculus based on his KUB images today. I have reviewed options with him including a trial of medical expulsion therapy versus definitive treatment. Considering his pain symptoms, he would like to pursue with scheduling surgery in the near future and in the meantime make an attempt to try to pass a stone. He was provided a prescription for oxycodone/acetaminophen, tamsulosin, and Zofran. He understands that we would need to proceed with more urgent therapy should he develop fever, uncontrolled pain, or persistent nausea and vomiting that cannot be well controlled. I will plan to discuss his situation with Dr. Isabel CapriceGrapey. Dr. Isabel CapriceGrapey is unable to treat him in the near future, I told him that I do likely have some time next Monday and could treat him at that point. After reviewing treatment options including the pros and cons of shockwave lithotripsy and ureteroscopic laser lithotripsy, he does adamantly wish to proceed with left ureteroscopic laser lithotripsy for treatment. He has previously undergone shockwave lithotripsy and does wish to proceed with more definitive therapy at this time. We reviewed the potential risks, complications, and expected recovery process associated with this procedure. He gives his informed consent to proceed and will be scheduled appropriately.   Procedure Cystoscopy with left retrograde pyelogram and attempted flexible ureteroscopy. Ureteroscope could not be passed into the proximal ureter nor could an access sheath be passed. He underwent ureteral stent placement and will plan to proceed with definitive therapy after passive stent dilation.   Signatures Electronically  signed by : Heloise PurpuraLester Colie Fugitt, M.D.; Dec 20 2013  4:34PM EST

## 2014-01-06 ENCOUNTER — Encounter (HOSPITAL_COMMUNITY): Payer: 59 | Admitting: Anesthesiology

## 2014-01-06 ENCOUNTER — Encounter (HOSPITAL_COMMUNITY)
Admission: RE | Disposition: A | Discharge status: Home / Self Care | Payer: Self-pay | Source: Ambulatory Visit | Attending: Urology

## 2014-01-06 ENCOUNTER — Ambulatory Visit (HOSPITAL_COMMUNITY)
Admission: RE | Admit: 2014-01-06 | Discharge: 2014-01-06 | Disposition: A | Discharge status: Home / Self Care | Payer: 59 | Source: Ambulatory Visit | Attending: Urology | Admitting: Urology

## 2014-01-06 ENCOUNTER — Ambulatory Visit (HOSPITAL_COMMUNITY): Payer: 59 | Admitting: Anesthesiology

## 2014-01-06 ENCOUNTER — Encounter (HOSPITAL_COMMUNITY): Payer: Self-pay | Admitting: *Deleted

## 2014-01-06 DIAGNOSIS — N202 Calculus of kidney with calculus of ureter: Secondary | ICD-10-CM | POA: Insufficient documentation

## 2014-01-06 DIAGNOSIS — E780 Pure hypercholesterolemia: Secondary | ICD-10-CM | POA: Insufficient documentation

## 2014-01-06 DIAGNOSIS — I10 Essential (primary) hypertension: Secondary | ICD-10-CM | POA: Insufficient documentation

## 2014-01-06 DIAGNOSIS — F17210 Nicotine dependence, cigarettes, uncomplicated: Secondary | ICD-10-CM | POA: Insufficient documentation

## 2014-01-06 HISTORY — PX: CYSTOSCOPY/RETROGRADE/URETEROSCOPY/STONE EXTRACTION WITH BASKET: SHX5317

## 2014-01-06 SURGERY — CYSTOSCOPY, WITH CALCULUS REMOVAL USING BASKET
Anesthesia: General | Laterality: Left

## 2014-01-06 MED ORDER — SODIUM CHLORIDE 0.9 % IJ SOLN
INTRAMUSCULAR | Status: AC
  Filled 2014-01-06: qty 10

## 2014-01-06 MED ORDER — LIDOCAINE HCL (CARDIAC) 20 MG/ML IV SOLN
INTRAVENOUS | Status: DC | PRN
  Administered 2014-01-06: 50 mg via INTRAVENOUS

## 2014-01-06 MED ORDER — IOHEXOL 300 MG/ML  SOLN
INTRAMUSCULAR | Status: DC | PRN
  Administered 2014-01-06: 5 mL

## 2014-01-06 MED ORDER — HYDROCODONE-ACETAMINOPHEN 5-325 MG PO TABS: 1.0000 | ORAL_TABLET | Freq: Four times a day (QID) | ORAL | Status: DC | PRN

## 2014-01-06 MED ORDER — CIPROFLOXACIN HCL 500 MG PO TABS: 500.0000 mg | ORAL_TABLET | Freq: Two times a day (BID) | ORAL | Status: DC

## 2014-01-06 MED ORDER — PHENYLEPHRINE HCL 10 MG/ML IJ SOLN
INTRAMUSCULAR | Status: DC | PRN
Start: 2014-01-06 — End: 2014-01-06
  Administered 2014-01-06 (×2): 40 ug via INTRAVENOUS

## 2014-01-06 MED ORDER — MIDAZOLAM HCL 2 MG/2ML IJ SOLN
INTRAMUSCULAR | Status: AC
  Filled 2014-01-06: qty 2

## 2014-01-06 MED ORDER — FENTANYL CITRATE 0.05 MG/ML IJ SOLN
INTRAMUSCULAR | Status: AC
  Filled 2014-01-06: qty 5

## 2014-01-06 MED ORDER — LACTATED RINGERS IV SOLN: INTRAVENOUS | Status: DC

## 2014-01-06 MED ORDER — PROPOFOL 10 MG/ML IV BOLUS
INTRAVENOUS | Status: DC | PRN
  Administered 2014-01-06: 150 mg via INTRAVENOUS

## 2014-01-06 MED ORDER — CIPROFLOXACIN IN D5W 400 MG/200ML IV SOLN
400.0000 mg | INTRAVENOUS | Status: AC
  Administered 2014-01-06: 400 mg via INTRAVENOUS

## 2014-01-06 MED ORDER — PROPOFOL 10 MG/ML IV BOLUS
INTRAVENOUS | Status: AC
  Filled 2014-01-06: qty 20

## 2014-01-06 MED ORDER — LIDOCAINE HCL 2 % EX GEL
CUTANEOUS | Status: DC | PRN
  Administered 2014-01-06: 1 via URETHRAL

## 2014-01-06 MED ORDER — PHENYLEPHRINE 40 MCG/ML (10ML) SYRINGE FOR IV PUSH (FOR BLOOD PRESSURE SUPPORT)
PREFILLED_SYRINGE | INTRAVENOUS | Status: AC
  Filled 2014-01-06: qty 10

## 2014-01-06 MED ORDER — ONDANSETRON HCL 4 MG/2ML IJ SOLN
INTRAMUSCULAR | Status: DC | PRN
  Administered 2014-01-06: 4 mg via INTRAVENOUS

## 2014-01-06 MED ORDER — LACTATED RINGERS IV SOLN
INTRAVENOUS | Status: DC
  Administered 2014-01-06: 1000 mL via INTRAVENOUS

## 2014-01-06 MED ORDER — FENTANYL CITRATE 0.05 MG/ML IJ SOLN: 25.0000 ug | INTRAMUSCULAR | Status: DC | PRN

## 2014-01-06 MED ORDER — MIDAZOLAM HCL 5 MG/5ML IJ SOLN
INTRAMUSCULAR | Status: DC | PRN
  Administered 2014-01-06 (×2): 2 mg via INTRAVENOUS

## 2014-01-06 MED ORDER — CIPROFLOXACIN IN D5W 400 MG/200ML IV SOLN
INTRAVENOUS | Status: AC
  Filled 2014-01-06: qty 200

## 2014-01-06 MED ORDER — FENTANYL CITRATE 0.05 MG/ML IJ SOLN
INTRAMUSCULAR | Status: DC | PRN
  Administered 2014-01-06: 50 ug via INTRAVENOUS

## 2014-01-06 MED ORDER — SODIUM CHLORIDE 0.9 % IR SOLN
Status: DC | PRN
  Administered 2014-01-06: 2000 mL via INTRAVESICAL

## 2014-01-06 MED ORDER — LIDOCAINE HCL (CARDIAC) 20 MG/ML IV SOLN
INTRAVENOUS | Status: AC
Start: 2014-01-06 — End: 2014-01-06
  Filled 2014-01-06: qty 5

## 2014-01-06 MED ORDER — LIDOCAINE HCL 2 % EX GEL
CUTANEOUS | Status: AC
  Filled 2014-01-06: qty 10

## 2014-01-06 MED ORDER — ONDANSETRON HCL 4 MG/2ML IJ SOLN
INTRAMUSCULAR | Status: AC
  Filled 2014-01-06: qty 2

## 2014-01-06 SURGICAL SUPPLY — 20 items
BAG URO CATCHER STRL LF (DRAPE) ×3 IMPLANT
BASKET ZERO TIP NITINOL 2.4FR (BASKET) ×6 IMPLANT
CATH INTERMIT  6FR 70CM (CATHETERS) ×3 IMPLANT
CLOTH BEACON ORANGE TIMEOUT ST (SAFETY) ×6 IMPLANT
DRAPE CAMERA CLOSED 9X96 (DRAPES) ×3 IMPLANT
FIBER LASER FLEXIVA 1000 (UROLOGICAL SUPPLIES) IMPLANT
FIBER LASER FLEXIVA 200 (UROLOGICAL SUPPLIES) IMPLANT
FIBER LASER FLEXIVA 365 (UROLOGICAL SUPPLIES) IMPLANT
FIBER LASER FLEXIVA 550 (UROLOGICAL SUPPLIES) IMPLANT
FIBER LASER TRAC TIP (UROLOGICAL SUPPLIES) IMPLANT
GLOVE BIOGEL M STRL SZ7.5 (GLOVE) ×3 IMPLANT
GOWN STRL REUS W/TWL LRG LVL3 (GOWN DISPOSABLE) ×6 IMPLANT
GUIDEWIRE ANG ZIPWIRE 038X150 (WIRE) IMPLANT
GUIDEWIRE STR DUAL SENSOR (WIRE) ×3 IMPLANT
MANIFOLD NEPTUNE II (INSTRUMENTS) ×3 IMPLANT
PACK CYSTO (CUSTOM PROCEDURE TRAY) ×3 IMPLANT
SHEATH ACCESS URETERAL 38CM (SHEATH) ×3 IMPLANT
STENT CONTOUR 6FRX26X.038 (STENTS) ×3 IMPLANT
TUBING CONNECTING 10 (TUBING) ×2 IMPLANT
TUBING CONNECTING 10' (TUBING) ×1

## 2014-01-06 NOTE — Discharge Instructions (Signed)
1. You may see some blood in the urine and may have some burning with urination for 48-72 hours. You also may notice that you have to urinate more frequently or urgently after your procedure which is normal.  2. You should call should you develop an inability urinate, fever > 101, persistent nausea and vomiting that prevents you from eating or drinking to stay hydrated.  3. If you have a stent, you will likely urinate more frequently and urgently until the stent is removed and you may experience some discomfort/pain in the lower abdomen and flank especially when urinating. You may take pain medication prescribed to you if needed for pain. You may also intermittently have blood in the urine until the stent is removed. 4. You may remove your stent on Monday morning as instructed.

## 2014-01-06 NOTE — Transfer of Care (Signed)
Immediate Anesthesia Transfer of Care Note  Patient: Gabriel Clark  Procedure(s) Performed: Procedure(s): CYSTOSCOPY/RETROGRADE/URETEROSCOPY/STONE EXTRACTION WITH BASKET, STENT REPLACEMENT (Left)  Patient Location: PACU  Anesthesia Type:General  Level of Consciousness: awake, alert  and oriented  Airway & Oxygen Therapy: Patient Spontanous Breathing and Patient connected to face mask oxygen  Post-op Assessment: Report given to PACU RN and Post -op Vital signs reviewed and stable  Post vital signs: Reviewed and stable  Complications: No apparent anesthesia complications

## 2014-01-06 NOTE — Anesthesia Postprocedure Evaluation (Signed)
  Anesthesia Post-op Note  Patient: Gabriel Clark  Procedure(s) Performed: Procedure(s) (LRB): CYSTOSCOPY/RETROGRADE/URETEROSCOPY/STONE EXTRACTION WITH BASKET, STENT REPLACEMENT (Left)  Patient Location: PACU  Anesthesia Type: General  Level of Consciousness: awake and alert   Airway and Oxygen Therapy: Patient Spontanous Breathing  Post-op Pain: mild  Post-op Assessment: Post-op Vital signs reviewed, Patient's Cardiovascular Status Stable, Respiratory Function Stable, Patent Airway and No signs of Nausea or vomiting  Last Vitals:  Filed Vitals:   01/06/14 1415  BP: 110/73  Pulse: 52  Temp:   Resp: 12    Post-op Vital Signs: stable   Complications: No apparent anesthesia complications

## 2014-01-06 NOTE — Op Note (Signed)
Preoperative diagnosis: Left renal calculi  Postoperative diagnosis: Left renal calculi  Procedure:  1. Cystoscopy 2. Left ureteroscopy and stone removal 3. Left ureteral stent placement (6 x 26 with string) 4. Left retrograde pyelography with interpretation  Surgeon: Gabriel Clark, Gabriel Clark.  Anesthesia: General  Complications: None  Intraoperative findings: Left retrograde pyelography demonstrated fillings defect within the left renal pelvis consistent with the patient's known calculus without other abnormalities noted.  EBL: Minimal  Specimens: 1. Left renal calculi  Disposition of specimens: [facility] for stone analysis  Indication: Gabriel Clark  is a 53 y.o. patient with urolithiasis and known left renal calculi. He recently underwent an attempt at ureteroscopic stone removal but his ureter was too small to be adequately accessed.  He therefore underwent ureteral stent placement and returns today for definitive ureteroscopic therapy. After reviewing the management options for treatment, they elected to proceed with the above surgical procedure(s). We have discussed the potential benefits and risks of the procedure, side effects of the proposed treatment, the likelihood of the patient achieving the goals of the procedure, and any potential problems that might occur during the procedure or recuperation. Informed consent has been obtained.  Description of procedure:  The patient was taken to the operating room and general anesthesia was induced.  The patient was placed in the dorsal lithotomy position, prepped and draped in the usual sterile fashion, and preoperative antibiotics were administered. A preoperative time-out was performed.   Cystourethroscopy was performed.  The patient's urethra was examined and was normal. The bladder was then systematically examined in its entirety. There was no evidence for any bladder tumors, stones, or other mucosal  pathology.    Attention then turned to the left ureteral orifice. The patient's indwelling left ureteral stent was identified and brought out to the urethral meatus with a flexible grasper.  A 6 French ureteral catheter was placed over the wire.  Omnipaque contrast was injected through the ureteral catheter and a retrograde pyelogram was performed with findings as dictated above. Specifically, no ureteral filling defects were identified indicating that the only stones present were within the renal pelvis.  A 0.38 sensor guidewire was then advanced up the left ureter into the renal pelvis under fluoroscopic guidance.  A 12/14 Fr ureteral access sheath was then advanced over the guide wire. The digital flexible ureteroscope was then advanced through the access sheath into the ureter next to the guidewire and the calculi were identified and was located in the interpolar and lower pole calyces. The stones were removed with the aid of the 0 tip nitinol basket.   Reinspection of the ureter/renal pelvis revealed no remaining visible stones or fragments of significant size.   The safety wire was then replaced and the access sheath removed.  The guidewire was backloaded through the cystoscope and a ureteral stent was advance over the wire using Seldinger technique.  The stent was positioned appropriately under fluoroscopic and cystoscopic guidance.  The wire was then removed with an adequate stent curl noted in the renal pelvis as well as in the bladder.  The bladder was then emptied and the procedure ended.  The patient appeared to tolerate the procedure well and without complications.  The patient was able to be awakened and transferred to the recovery unit in satisfactory condition.   Gabriel Clark, Gabriel Clark

## 2014-01-06 NOTE — Interval H&P Note (Signed)
History and Physical Interval Note:  01/06/2014 12:05 PM  Gabriel Clark  has presented today for surgery, with the diagnosis of LEFT URETERAL CALCULUS  The various methods of treatment have been discussed with the patient and family. After consideration of risks, benefits and other options for treatment, the patient has consented to  Procedure(s): CYSTOSCOPY WITH URETEROSCOPY AND STENT REPLACEMENT (Left) HOLMIUM LASER APPLICATION (Left) as a surgical intervention .  The patient's history has been reviewed, patient examined, no change in status, stable for surgery.  I have reviewed the patient's chart and labs.  Questions were answered to the patient's satisfaction.     Lyvonne Cassell,LES

## 2014-01-06 NOTE — Anesthesia Preprocedure Evaluation (Addendum)
Anesthesia Evaluation  Patient identified by MRN, date of birth, ID band Patient awake    Reviewed: Allergy & Precautions, H&P , NPO status , Patient's Chart, lab work & pertinent test results, reviewed documented beta blocker date and time   History of Anesthesia Complications (+) PONV  Airway Mallampati: II TM Distance: >3 FB Neck ROM: full    Dental no notable dental hx. (+) Teeth Intact, Dental Advisory Given   Pulmonary neg pulmonary ROS, Current Smoker, former smoker,  breath sounds clear to auscultation  Pulmonary exam normal       Cardiovascular Exercise Tolerance: Good negative cardio ROS  Rhythm:regular Rate:Normal     Neuro/Psych negative neurological ROS  negative psych ROS   GI/Hepatic negative GI ROS, Neg liver ROS,   Endo/Other  negative endocrine ROS  Renal/GU negative Renal ROS  negative genitourinary   Musculoskeletal   Abdominal   Peds  Hematology negative hematology ROS (+)   Anesthesia Other Findings   Reproductive/Obstetrics negative OB ROS                          Anesthesia Physical Anesthesia Plan  ASA: II  Anesthesia Plan: General   Post-op Pain Management:    Induction: Intravenous  Airway Management Planned: LMA  Additional Equipment:   Intra-op Plan:   Post-operative Plan:   Informed Consent:   Plan Discussed with: Surgeon  Anesthesia Plan Comments:         Anesthesia Quick Evaluation

## 2014-01-07 ENCOUNTER — Encounter (HOSPITAL_COMMUNITY): Payer: Self-pay | Admitting: Urology

## 2015-09-27 ENCOUNTER — Other Ambulatory Visit: Payer: Self-pay | Admitting: Urology

## 2015-09-27 ENCOUNTER — Encounter (HOSPITAL_COMMUNITY): Payer: Self-pay | Admitting: *Deleted

## 2015-09-28 ENCOUNTER — Encounter (HOSPITAL_COMMUNITY): Payer: Self-pay | Admitting: General Practice

## 2015-09-28 ENCOUNTER — Encounter (HOSPITAL_COMMUNITY)
Admission: RE | Disposition: A | Discharge status: Home / Self Care | Payer: Self-pay | Source: Ambulatory Visit | Attending: Urology

## 2015-09-28 ENCOUNTER — Ambulatory Visit (HOSPITAL_COMMUNITY): Payer: 59

## 2015-09-28 ENCOUNTER — Ambulatory Visit (HOSPITAL_COMMUNITY)
Admission: RE | Admit: 2015-09-28 | Discharge: 2015-09-28 | Disposition: A | Discharge status: Home / Self Care | Payer: 59 | Source: Ambulatory Visit | Attending: Urology | Admitting: Urology

## 2015-09-28 DIAGNOSIS — I10 Essential (primary) hypertension: Secondary | ICD-10-CM | POA: Insufficient documentation

## 2015-09-28 DIAGNOSIS — N202 Calculus of kidney with calculus of ureter: Secondary | ICD-10-CM | POA: Diagnosis present

## 2015-09-28 DIAGNOSIS — Z7982 Long term (current) use of aspirin: Secondary | ICD-10-CM | POA: Diagnosis not present

## 2015-09-28 DIAGNOSIS — Z79899 Other long term (current) drug therapy: Secondary | ICD-10-CM | POA: Diagnosis not present

## 2015-09-28 DIAGNOSIS — E7800 Pure hypercholesterolemia, unspecified: Secondary | ICD-10-CM | POA: Insufficient documentation

## 2015-09-28 DIAGNOSIS — N201 Calculus of ureter: Secondary | ICD-10-CM

## 2015-09-28 DIAGNOSIS — Z87891 Personal history of nicotine dependence: Secondary | ICD-10-CM | POA: Insufficient documentation

## 2015-09-28 SURGERY — LITHOTRIPSY, ESWL
Anesthesia: LOCAL | Laterality: Left

## 2015-09-28 MED ORDER — DIPHENHYDRAMINE HCL 25 MG PO CAPS
25.0000 mg | ORAL_CAPSULE | ORAL | Status: AC
  Administered 2015-09-28: 25 mg via ORAL
  Filled 2015-09-28: qty 1

## 2015-09-28 MED ORDER — DIAZEPAM 5 MG PO TABS
10.0000 mg | ORAL_TABLET | ORAL | Status: AC
  Administered 2015-09-28: 10 mg via ORAL
  Filled 2015-09-28: qty 2

## 2015-09-28 MED ORDER — SODIUM CHLORIDE 0.9 % IV SOLN
INTRAVENOUS | Status: DC
  Administered 2015-09-28: 10:00:00 via INTRAVENOUS

## 2015-09-28 MED ORDER — CIPROFLOXACIN HCL 500 MG PO TABS
500.0000 mg | ORAL_TABLET | ORAL | Status: AC
  Administered 2015-09-28: 500 mg via ORAL
  Filled 2015-09-28: qty 1

## 2015-09-28 NOTE — Discharge Instructions (Signed)
Dietary Guidelines to Help Prevent Kidney Stones °Your risk of kidney stones can be decreased by adjusting the foods you eat. The most important thing you can do is drink enough fluid. You should drink enough fluid to keep your urine clear or pale yellow. The following guidelines provide specific information for the type of kidney stone you have had. °GUIDELINES ACCORDING TO TYPE OF KIDNEY STONE °Calcium Oxalate Kidney Stones °· Reduce the amount of salt you eat. Foods that have a lot of salt cause your body to release excess calcium into your urine. The excess calcium can combine with a substance called oxalate to form kidney stones. °· Reduce the amount of animal protein you eat if the amount you eat is excessive. Animal protein causes your body to release excess calcium into your urine. Ask your dietitian how much protein from animal sources you should be eating. °· Avoid foods that are high in oxalates. If you take vitamins, they should have less than 500 mg of vitamin C. Your body turns vitamin C into oxalates. You do not need to avoid fruits and vegetables high in vitamin C. °Calcium Phosphate Kidney Stones °· Reduce the amount of salt you eat to help prevent the release of excess calcium into your urine. °· Reduce the amount of animal protein you eat if the amount you eat is excessive. Animal protein causes your body to release excess calcium into your urine. Ask your dietitian how much protein from animal sources you should be eating. °· Get enough calcium from food or take a calcium supplement (ask your dietitian for recommendations). Food sources of calcium that do not increase your risk of kidney stones include: °¨ Broccoli. °¨ Dairy products, such as cheese and yogurt. °¨ Pudding. °Uric Acid Kidney Stones °· Do not have more than 6 oz of animal protein per day. °FOOD SOURCES °Animal Protein Sources °· Meat (all types). °· Poultry. °· Eggs. °· Fish, seafood. °Foods High in Salt °· Salt seasonings. °· Soy  sauce. °· Teriyaki sauce. °· Cured and processed meats. °· Salted crackers and snack foods. °· Fast food. °· Canned soups and most canned foods. °Foods High in Oxalates °· Grains: °¨ Amaranth. °¨ Barley. °¨ Grits. °¨ Wheat germ. °¨ Bran. °¨ Buckwheat flour. °¨ All bran cereals. °¨ Pretzels. °¨ Whole wheat bread. °· Vegetables: °¨ Beans (wax). °¨ Beets and beet greens. °¨ Collard greens. °¨ Eggplant. °¨ Escarole. °¨ Leeks. °¨ Okra. °¨ Parsley. °¨ Rutabagas. °¨ Spinach. °¨ Swiss chard. °¨ Tomato paste. °¨ Fried potatoes. °¨ Sweet potatoes. °· Fruits: °¨ Red currants. °¨ Figs. °¨ Kiwi. °¨ Rhubarb. °· Meat and Other Protein Sources: °¨ Beans (dried). °¨ Soy burgers and other soybean products. °¨ Miso. °¨ Nuts (peanuts, almonds, pecans, cashews, hazelnuts). °¨ Nut butters. °¨ Sesame seeds and tahini (paste made of sesame seeds). °¨ Poppy seeds. °· Beverages: °¨ Chocolate drink mixes. °¨ Soy milk. °¨ Instant iced tea. °¨ Juices made from high-oxalate fruits or vegetables. °· Other: °¨ Carob. °¨ Chocolate. °¨ Fruitcake. °¨ Marmalades. °  °This information is not intended to replace advice given to you by your health care provider. Make sure you discuss any questions you have with your health care provider. °  °Document Released: 06/29/2010 Document Revised: 03/09/2013 Document Reviewed: 01/29/2013 °Elsevier Interactive Patient Education ©2016 [employer] ° ° °Kidney Stones °Kidney stones (urolithiasis) are deposits that form inside your kidneys. The intense pain is caused by the stone moving through the urinary tract. When the stone moves, the   ureter goes into spasm around the stone. The stone is usually passed in the urine.  °CAUSES  °· A disorder that makes certain neck glands produce too much parathyroid hormone (primary hyperparathyroidism). °· A buildup of uric acid crystals, similar to gout in your joints. °· Narrowing (stricture) of the ureter. °· A kidney obstruction present at birth (congenital  obstruction). °· Previous surgery on the kidney or ureters. °· Numerous kidney infections. °SYMPTOMS  °· Feeling sick to your stomach (nauseous). °· Throwing up (vomiting). °· Blood in the urine (hematuria). °· Pain that usually spreads (radiates) to the groin. °· Frequency or urgency of urination. °DIAGNOSIS  °· Taking a history and physical exam. °· Blood or urine tests. °· CT scan. °· Occasionally, an examination of the inside of the urinary bladder (cystoscopy) is performed. °TREATMENT  °· Observation. °· Increasing your fluid intake. °· Extracorporeal shock wave lithotripsy--This is a noninvasive procedure that uses shock waves to break up kidney stones. °· Surgery may be needed if you have severe pain or persistent obstruction. There are various surgical procedures. Most of the procedures are performed with the use of small instruments. Only small incisions are needed to accommodate these instruments, so recovery time is minimized. °The size, location, and chemical composition are all important variables that will determine the proper choice of action for you. Talk to your health care provider to better understand your situation so that you will minimize the risk of injury to yourself and your kidney.  °HOME CARE INSTRUCTIONS  °· Drink enough water and fluids to keep your urine clear or pale yellow. This will help you to pass the stone or stone fragments. °· Strain all urine through the provided strainer. Keep all particulate matter and stones for your health care provider to see. The stone causing the pain may be as small as a grain of salt. It is very important to use the strainer each and every time you pass your urine. The collection of your stone will allow your health care provider to analyze it and verify that a stone has actually passed. The stone analysis will often identify what you can do to reduce the incidence of recurrences. °· Only take over-the-counter or prescription medicines for pain,  discomfort, or fever as directed by your health care provider. °· Keep all follow-up visits as told by your health care provider. This is important. °· Get follow-up X-rays if required. The absence of pain does not always mean that the stone has passed. It may have only stopped moving. If the urine remains completely obstructed, it can cause loss of kidney function or even complete destruction of the kidney. It is your responsibility to make sure X-rays and follow-ups are completed. Ultrasounds of the kidney can show blockages and the status of the kidney. Ultrasounds are not associated with any radiation and can be performed easily in a matter of minutes. °· Make changes to your daily diet as told by your health care provider. You may be told to: °¨ Limit the amount of salt that you eat. °¨ Eat 5 or more servings of fruits and vegetables each day. °¨ Limit the amount of meat, poultry, fish, and eggs that you eat. °· Collect a 24-hour urine sample as told by your health care provider. You may need to collect another urine sample every 6-12 months. °SEEK MEDICAL CARE IF: °· You experience pain that is progressive and unresponsive to any pain medicine you have been prescribed. °SEEK IMMEDIATE MEDICAL CARE IF:  °·   Pain cannot be controlled with the prescribed medicine. °· You have a fever or shaking chills. °· The severity or intensity of pain increases over 18 hours and is not relieved by pain medicine. °· You develop a new onset of abdominal pain. °· You feel faint or pass out. °· You are unable to urinate. °  °This information is not intended to replace advice given to you by your health care provider. Make sure you discuss any questions you have with your health care provider. °  °Document Released: 03/04/2005 Document Revised: 11/23/2014 Document Reviewed: 08/05/2012 °Elsevier Interactive Patient Education ©2016 [employer] ° °

## 2015-09-28 NOTE — H&P (Signed)
Office Visit Report     09/25/2015   --------------------------------------------------------------------------------   Novella RobLane D. Mercy RidingCahill  MRN: [id number]  PRIMARY CARE:  Tally Joeavid Swayne, MD  DOB: 08-09-1960, 55 year old Male  REFERRING:  Tally Joeavid Swayne, MD  SSN: [id number]  PROVIDER:  Anne FuLarry Gibson    LOCATION:  [facility] [phone]   --------------------------------------------------------------------------------   CC: I have kidney stones.  HPI: Gabriel Clark is a 55 year-old male established patient who is here for renal calculi.  He is complaining of left lower back pain and now left lower quadrant abdominal pain that has worsened over the weekend. It is associated with nausea and vomiting. No changes in lower urinary tract symptoms. He is afebrile. He has not seen the recent passage of a kidney stone. Previous imaging studies in December 2016 noted lower pole calculi on the left side. He denies constipation.   The problem is on the left side. He first stated noticing pain on approximately 09/21/2015. This is not his first kidney stone. He is currently having flank pain, back pain, and nausea. He denies having groin pain, vomiting, fever, and chills. He has not caught a stone in his urine strainer since his symptoms began.     ALLERGIES: No Allergies    MEDICATIONS: Aspirin 81 MG Oral Tablet Delayed Release Oral  CoQ10 CAPS Oral  Garlic  Krill Oil  L-Arginine Powder Does Not Apply  Levitra 20 MG Oral Tablet Oral  Multi-Day Vitamins TABS Oral  Simvastatin TABS 0 Oral  Super B Complex     GU PSH: Cysto Uretero Remove Stone - 01/11/2014 Cystoscopy Insert Stent - 01/11/2014, 12/24/2013 Cystoscopy Ureteroscopy - 12/24/2013 Renal ESWL - 2013 Vasectomy - 2010      PSH Notes: Cystoscopy With Ureteroscopy With Removal Of Calculus, Cystoscopy With Insertion Of Ureteral Stent Left, Cystoscopy With Insertion Of Ureteral Stent Left, Cystoscopy With Ureteroscopy, Lithotripsy,  Neuroplasty With Transposition Of Ulnar Nerve, Surgery Of Male Genitalia Vasectomy, Leg Repair   NON-GU PSH: Revise Ulnar Nerve At Elbow - 2012    GU PMH: Kidney Stone, Nephrolithiasis - 02/20/2015 Calculus Ureter, Ureteral stone - 01/17/2014 Anogenital (venereal) warts, Condyloma acuminata - 2014 ED, arterial insufficiency, Erectile dysfunction due to arterial insufficiency - 2014    NON-GU PMH: Encounter for general adult medical examination without abnormal findings, Encounter for preventive health examination - 02/20/2015 Personal history of other diseases of the circulatory system, History of hypertension - 2014 Personal history of other endocrine, nutritional and metabolic disease, History of hypercholesterolemia - 2014    FAMILY HISTORY: Family Health Status Number - Runs In Family Prostate Cancer - Runs In Family   SOCIAL HISTORY: Marital Status: Married Current Smoking Status: Patient does not smoke anymore.  Has never drank.  Drinks 2 caffeinated drinks per day.     Notes: Current every day smoker, Occupation:, Being A Therapist, sportsocial Drinker, Marital History - Currently Married, Tobacco Use   REVIEW OF SYSTEMS:    GU Review Male:   Patient reports get up at night to urinate, trouble starting your stream, and erection problems. Patient denies frequent urination, hard to postpone urination, burning/ pain with urination, leakage of urine, stream starts and stops, have to strain to urinate , and penile pain.  Gastrointestinal (Upper):   Patient reports nausea and vomiting. Patient denies indigestion/ heartburn.  Gastrointestinal (Lower):   Patient denies constipation and diarrhea.  Constitutional:   Patient denies fever, night sweats, weight loss, and fatigue.  Skin:   Patient denies skin rash/  lesion and itching.  Eyes:   Patient denies blurred vision and double vision.  Ears/ Nose/ Throat:   Patient denies sore throat and sinus problems.  Hematologic/Lymphatic:   Patient denies swollen  glands and easy bruising.  Cardiovascular:   Patient denies leg swelling and chest pains.  Respiratory:   Patient denies cough and shortness of breath.  Endocrine:   Patient denies excessive thirst.  Musculoskeletal:   Patient reports back pain. Patient denies joint pain.  Neurological:   Patient denies headaches and dizziness.  Psychologic:   Patient denies depression and anxiety.   VITAL SIGNS:      09/25/2015 10:40 AM  Weight 211 lb / 95.71 kg  Height 74 in / 187.96 cm  BP 114/76 mmHg  Pulse 59 /min  Temperature 97.8 F / 37 C  BMI 27.1 kg/m   MULTI-SYSTEM PHYSICAL EXAMINATION:    Constitutional: Well-nourished. No physical deformities. Normally developed. Good grooming.  Respiratory: No labored breathing, no use of accessory muscles.   Cardiovascular: Normal temperature, normal extremity pulses, no swelling, no varicosities.  Lymphatic: No enlargement of neck, axillae, groin.  Skin: No paleness, no jaundice, no cyanosis. No lesion, no ulcer, no rash.  Neurologic / Psychiatric: Oriented to time, oriented to place, oriented to person. No depression, no anxiety, no agitation.  Gastrointestinal: No mass, no tenderness, no rigidity, non obese abdomen.  Musculoskeletal: Spine, ribs, pelvis no bilateral tenderness. Normal gait and station of head and neck.     PAST DATA REVIEWED:  Source Of History:  Patient  Records Review:   Previous Patient Records  Urine Test Review:   Urinalysis  X-Ray Review: KUB: Reviewed Films.     09/25/15  Urinalysis  Urine Appearance Clear   Urine Specimen Voided   Urine Color Yellow   Urine Glucose Neg   Urine Bilirubin Neg   Urine Ketones Neg   Urine Specific Gravity 1.020   Urine Blood Neg   Urine pH 6.0   Urine Protein Trace   Urine Urobilinogen 0.2   Urine Nitrites Neg   Urine Leukocyte Esterase Neg    PROCEDURES:         KUB - 74000  A single view of the abdomen is obtained.  Bony Abnormalities:  Pelvic blasts.  Fecal Stasis:   Moderate fecal stasis.  Calculi:  Lower pole left kidney calculi. Proximal left ureter calculi. Left Ureteral calcification size: 5.2 mm.      Arrow drawn in PACS.         Urinalysis - 81003 Dipstick Dipstick Cont'd  Specimen: Voided Bilirubin: Neg  Color: Yellow Ketones: Neg  Appearance: Clear Blood: Neg  Specific Gravity: 1.020 Protein: Trace  pH: 6.0 Urobilinogen: 0.2  Glucose: Neg Nitrites: Neg    Leukocyte Esterase: Neg    ASSESSMENT:      ICD-10 Details  1 GU:   Kidney Stone - N20.0   2   Kidney Stone - N20.0    PLAN:            Medications New Meds: Ondansetron Odt 4 mg tablet,disintegrating 1 tablet PO Q 6 H PRN   #30  0 Refill(s)  Oxycodone-Acetaminophen 5 mg-325 mg tablet 1 tablet PO Q 4 H PRN   #20  0 Refill(s)            Orders Labs Urine Culture and Sensitivity  X-Rays: KUB          Schedule Return Visit: Next Available Appointment - Schedule Surgery  Document Letter(s):  Created for Patient: Clinical Summary         Notes:   left proximal ureteral stone.   I will try to schedule him for ESWL in the next week. He has had this procedure before. We discussed risks and benefits of the procedure. I will provide him with pain medication, anti nausea medication, an alpha blocker and strainer.      Signed by Anne Fu on 09/25/15 at 11:42 AM (EDT)     The information contained in this medical record document is considered private and confidential patient information. This information can only be used for the medical diagnosis and/or medical services that are being provided by the patient's selected caregivers. This information can only be distributed outside of the patient's care if the patient agrees and signs waivers of authorization for this information to be sent to an outside source or route.

## 2015-09-28 NOTE — Interval H&P Note (Signed)
History and Physical Interval Note:  09/28/2015 12:17 PM  Gabriel Clark  has presented today for surgery, with the diagnosis of LEFT URETERAL STONE  The various methods of treatment have been discussed with the patient and family. After consideration of risks, benefits and other options for treatment, the patient has consented to  Procedure(s): LEFT EXTRACORPOREAL SHOCK WAVE LITHOTRIPSY (ESWL) (Left) as a surgical intervention .  The patient's history has been reviewed, patient examined, no change in status, stable for surgery.  I have reviewed the patient's chart and labs.  Questions were answered to the patient's satisfaction.     Mady Oubre I Krystol Rocco  Left arm marked

## 2015-10-11 ENCOUNTER — Other Ambulatory Visit: Payer: Self-pay | Admitting: Urology

## 2015-10-13 ENCOUNTER — Encounter (HOSPITAL_BASED_OUTPATIENT_CLINIC_OR_DEPARTMENT_OTHER): Payer: Self-pay | Admitting: *Deleted

## 2015-10-13 NOTE — Progress Notes (Signed)
NPO AFTER MN, PT VERBALIZED UNDERSTANDING THIS INCLUDES NO DIP TOBACCO.   ARRIVE AT 0700.  NEEDS HG .  MAY TAKE OXYCODONE AM DOS W/ SIPS OF WATER.

## 2015-10-17 NOTE — Anesthesia Preprocedure Evaluation (Addendum)
Anesthesia Evaluation  Patient identified by MRN, date of birth, ID band Patient awake    Reviewed: Allergy & Precautions, NPO status , Patient's Chart, lab work & pertinent test results  History of Anesthesia Complications (+) PONV and history of anesthetic complications  Airway Mallampati: II  TM Distance: >3 FB Neck ROM: Full    Dental no notable dental hx. (+) Teeth Intact, Dental Advisory Given,    Pulmonary neg pulmonary ROS, former smoker,    Pulmonary exam normal breath sounds clear to auscultation       Cardiovascular negative cardio ROS Normal cardiovascular exam Rhythm:Regular Rate:Normal     Neuro/Psych negative neurological ROS  negative psych ROS   GI/Hepatic negative GI ROS, Neg liver ROS,   Endo/Other  negative endocrine ROS  Renal/GU Renal diseasenegative Renal ROS  negative genitourinary   Musculoskeletal negative musculoskeletal ROS (+)   Abdominal   Peds negative pediatric ROS (+)  Hematology negative hematology ROS (+)   Anesthesia Other Findings   Reproductive/Obstetrics negative OB ROS                            Anesthesia Physical Anesthesia Plan  ASA: II  Anesthesia Plan: General   Post-op Pain Management:    Induction: Intravenous  Airway Management Planned: LMA  Additional Equipment:   Intra-op Plan:   Post-operative Plan: Extubation in OR  Informed Consent: I have reviewed the patients History and Physical, chart, labs and discussed the procedure including the risks, benefits and alternatives for the proposed anesthesia with the patient or authorized representative who has indicated his/her understanding and acceptance.   Dental advisory given  Plan Discussed with: CRNA  Anesthesia Plan Comments:        Anesthesia Quick Evaluation

## 2015-10-18 ENCOUNTER — Ambulatory Visit (HOSPITAL_BASED_OUTPATIENT_CLINIC_OR_DEPARTMENT_OTHER): Payer: 59 | Admitting: Anesthesiology

## 2015-10-18 ENCOUNTER — Ambulatory Visit (HOSPITAL_BASED_OUTPATIENT_CLINIC_OR_DEPARTMENT_OTHER)
Admission: RE | Admit: 2015-10-18 | Discharge: 2015-10-18 | Disposition: A | Discharge status: Home / Self Care | Payer: 59 | Source: Ambulatory Visit | Attending: Urology | Admitting: Urology

## 2015-10-18 ENCOUNTER — Encounter (HOSPITAL_BASED_OUTPATIENT_CLINIC_OR_DEPARTMENT_OTHER)
Admission: RE | Disposition: A | Discharge status: Home / Self Care | Payer: Self-pay | Source: Ambulatory Visit | Attending: Urology

## 2015-10-18 ENCOUNTER — Encounter (HOSPITAL_BASED_OUTPATIENT_CLINIC_OR_DEPARTMENT_OTHER): Payer: Self-pay | Admitting: *Deleted

## 2015-10-18 DIAGNOSIS — Z87891 Personal history of nicotine dependence: Secondary | ICD-10-CM | POA: Insufficient documentation

## 2015-10-18 DIAGNOSIS — I10 Essential (primary) hypertension: Secondary | ICD-10-CM | POA: Insufficient documentation

## 2015-10-18 DIAGNOSIS — Z79899 Other long term (current) drug therapy: Secondary | ICD-10-CM | POA: Insufficient documentation

## 2015-10-18 DIAGNOSIS — Z7982 Long term (current) use of aspirin: Secondary | ICD-10-CM | POA: Diagnosis not present

## 2015-10-18 DIAGNOSIS — Z79891 Long term (current) use of opiate analgesic: Secondary | ICD-10-CM | POA: Diagnosis not present

## 2015-10-18 DIAGNOSIS — E7800 Pure hypercholesterolemia, unspecified: Secondary | ICD-10-CM | POA: Diagnosis not present

## 2015-10-18 DIAGNOSIS — N202 Calculus of kidney with calculus of ureter: Secondary | ICD-10-CM | POA: Diagnosis not present

## 2015-10-18 HISTORY — DX: Calculus of kidney: N20.0

## 2015-10-18 HISTORY — PX: CYSTOSCOPY WITH HOLMIUM LASER LITHOTRIPSY: SHX6639

## 2015-10-18 HISTORY — PX: STONE EXTRACTION WITH BASKET: SHX5318

## 2015-10-18 HISTORY — DX: Hyperlipidemia, unspecified: E78.5

## 2015-10-18 HISTORY — DX: Presence of spectacles and contact lenses: Z97.3

## 2015-10-18 HISTORY — DX: Male erectile dysfunction, unspecified: N52.9

## 2015-10-18 HISTORY — DX: Personal history of other infectious and parasitic diseases: Z86.19

## 2015-10-18 HISTORY — PX: CYSTOSCOPY WITH RETROGRADE PYELOGRAM, URETEROSCOPY AND STENT PLACEMENT: SHX5789

## 2015-10-18 LAB — POCT HEMOGLOBIN-HEMACUE: Hemoglobin: 13.1 g/dL (ref 13.0–17.0)

## 2015-10-18 SURGERY — CYSTOURETEROSCOPY, WITH RETROGRADE PYELOGRAM AND STENT INSERTION
Anesthesia: General | Site: Ureter | Laterality: Left

## 2015-10-18 MED ORDER — ONDANSETRON HCL 4 MG/2ML IJ SOLN
INTRAMUSCULAR | Status: DC | PRN
  Administered 2015-10-18: 4 mg via INTRAVENOUS

## 2015-10-18 MED ORDER — DEXAMETHASONE SODIUM PHOSPHATE 10 MG/ML IJ SOLN
INTRAMUSCULAR | Status: DC | PRN
  Administered 2015-10-18: 10 mg via INTRAVENOUS

## 2015-10-18 MED ORDER — OXYCODONE-ACETAMINOPHEN 5-325 MG PO TABS: 1.0000 | ORAL_TABLET | Freq: Four times a day (QID) | ORAL | 0 refills | Status: DC | PRN

## 2015-10-18 MED ORDER — SODIUM CHLORIDE 0.9 % IR SOLN
Status: DC | PRN
  Administered 2015-10-18: 1000 mL via INTRAVESICAL
  Administered 2015-10-18: 3000 mL via INTRAVESICAL

## 2015-10-18 MED ORDER — PROPOFOL 500 MG/50ML IV EMUL
INTRAVENOUS | Status: AC
  Filled 2015-10-18: qty 50

## 2015-10-18 MED ORDER — LIDOCAINE HCL (CARDIAC) 20 MG/ML IV SOLN
INTRAVENOUS | Status: AC
  Filled 2015-10-18: qty 5

## 2015-10-18 MED ORDER — LIDOCAINE HCL 2 % EX GEL
CUTANEOUS | Status: AC
  Filled 2015-10-18: qty 5

## 2015-10-18 MED ORDER — FENTANYL CITRATE (PF) 100 MCG/2ML IJ SOLN
INTRAMUSCULAR | Status: DC | PRN
Start: 2015-10-18 — End: 2015-10-18
  Administered 2015-10-18: 50 ug via INTRAVENOUS
  Administered 2015-10-18 (×2): 25 ug via INTRAVENOUS

## 2015-10-18 MED ORDER — PROPOFOL 500 MG/50ML IV EMUL
INTRAVENOUS | Status: DC | PRN
  Administered 2015-10-18: 200 mL via INTRAVENOUS

## 2015-10-18 MED ORDER — BELLADONNA ALKALOIDS-OPIUM 16.2-60 MG RE SUPP
RECTAL | Status: AC
  Filled 2015-10-18: qty 1

## 2015-10-18 MED ORDER — MIDAZOLAM HCL 5 MG/5ML IJ SOLN
INTRAMUSCULAR | Status: DC | PRN
  Administered 2015-10-18: 2 mg via INTRAVENOUS

## 2015-10-18 MED ORDER — DEXAMETHASONE SODIUM PHOSPHATE 10 MG/ML IJ SOLN
INTRAMUSCULAR | Status: AC
  Filled 2015-10-18: qty 1

## 2015-10-18 MED ORDER — MEPERIDINE HCL 25 MG/ML IJ SOLN
6.2500 mg | INTRAMUSCULAR | Status: DC | PRN
  Filled 2015-10-18: qty 1

## 2015-10-18 MED ORDER — LACTATED RINGERS IV SOLN
INTRAVENOUS | Status: DC
  Administered 2015-10-18: 08:00:00 via INTRAVENOUS
  Filled 2015-10-18: qty 1000

## 2015-10-18 MED ORDER — FENTANYL CITRATE (PF) 100 MCG/2ML IJ SOLN
INTRAMUSCULAR | Status: AC
  Filled 2015-10-18: qty 2

## 2015-10-18 MED ORDER — ARTIFICIAL TEARS OP OINT
TOPICAL_OINTMENT | OPHTHALMIC | Status: AC
  Filled 2015-10-18: qty 3.5

## 2015-10-18 MED ORDER — BELLADONNA ALKALOIDS-OPIUM 16.2-60 MG RE SUPP
RECTAL | Status: DC | PRN
  Administered 2015-10-18: 1 via RECTAL

## 2015-10-18 MED ORDER — LACTATED RINGERS IV SOLN
INTRAVENOUS | Status: DC
  Filled 2015-10-18: qty 1000

## 2015-10-18 MED ORDER — URELLE 81 MG PO TABS
1.0000 | ORAL_TABLET | Freq: Four times a day (QID) | ORAL | Status: DC
Start: 2015-10-18 — End: 2015-10-18
  Administered 2015-10-18: 81 mg via ORAL
  Filled 2015-10-18: qty 1

## 2015-10-18 MED ORDER — KETOROLAC TROMETHAMINE 30 MG/ML IJ SOLN
INTRAMUSCULAR | Status: DC | PRN
  Administered 2015-10-18: 30 mg via INTRAVENOUS

## 2015-10-18 MED ORDER — IOHEXOL 300 MG/ML  SOLN
INTRAMUSCULAR | Status: DC | PRN
  Administered 2015-10-18: 3 mL via URETHRAL

## 2015-10-18 MED ORDER — URIBEL 118 MG PO CAPS: 1.0000 | ORAL_CAPSULE | Freq: Three times a day (TID) | ORAL | 1 refills | Status: DC | PRN

## 2015-10-18 MED ORDER — FENTANYL CITRATE (PF) 100 MCG/2ML IJ SOLN
25.0000 ug | INTRAMUSCULAR | Status: DC | PRN
  Filled 2015-10-18: qty 1

## 2015-10-18 MED ORDER — CEFAZOLIN SODIUM-DEXTROSE 2-4 GM/100ML-% IV SOLN
INTRAVENOUS | Status: AC
  Filled 2015-10-18: qty 100

## 2015-10-18 MED ORDER — LIDOCAINE HCL 2 % EX GEL
CUTANEOUS | Status: DC | PRN
  Administered 2015-10-18: 1 via URETHRAL

## 2015-10-18 MED ORDER — ONDANSETRON HCL 4 MG/2ML IJ SOLN
INTRAMUSCULAR | Status: AC
  Filled 2015-10-18: qty 2

## 2015-10-18 MED ORDER — LIDOCAINE HCL (CARDIAC) 20 MG/ML IV SOLN
INTRAVENOUS | Status: DC | PRN
  Administered 2015-10-18: 80 mg via INTRAVENOUS

## 2015-10-18 MED ORDER — MIDAZOLAM HCL 2 MG/2ML IJ SOLN
INTRAMUSCULAR | Status: AC
  Filled 2015-10-18: qty 2

## 2015-10-18 MED ORDER — URELLE 81 MG PO TABS
ORAL_TABLET | ORAL | Status: AC
  Filled 2015-10-18: qty 1

## 2015-10-18 MED ORDER — FENTANYL CITRATE (PF) 100 MCG/2ML IJ SOLN
INTRAMUSCULAR | Status: AC
  Filled 2015-10-18: qty 4

## 2015-10-18 MED ORDER — METOCLOPRAMIDE HCL 5 MG/ML IJ SOLN
10.0000 mg | Freq: Once | INTRAMUSCULAR | Status: DC | PRN
  Filled 2015-10-18: qty 2

## 2015-10-18 MED ORDER — CEFAZOLIN SODIUM-DEXTROSE 2-4 GM/100ML-% IV SOLN
2.0000 g | INTRAVENOUS | Status: AC
  Administered 2015-10-18: 2 g via INTRAVENOUS
  Filled 2015-10-18: qty 100

## 2015-10-18 MED FILL — OXYCODONE/APAP 5/325MG: 5-325 | 3 days supply | Qty: 20 | Fill #0

## 2015-10-18 MED FILL — URIBEL CAPSULE: 118 | 5 days supply | Qty: 15 | Fill #0

## 2015-10-18 SURGICAL SUPPLY — 38 items
ADAPTER CATH URET PLST 4-6FR (CATHETERS) IMPLANT
BAG DRAIN URO-CYSTO SKYTR STRL (DRAIN) ×4 IMPLANT
BASKET LASER NITINOL 1.9FR (BASKET) IMPLANT
BASKET STNLS GEMINI 4WIRE 3FR (BASKET) IMPLANT
BASKET ZERO TIP NITINOL 2.4FR (BASKET) ×4 IMPLANT
BENZOIN TINCTURE PRP APPL 2/3 (GAUZE/BANDAGES/DRESSINGS) IMPLANT
CATH INTERMIT  6FR 70CM (CATHETERS) ×4 IMPLANT
CATH URET 5FR 28IN CONE TIP (BALLOONS)
CATH URET 5FR 28IN OPEN ENDED (CATHETERS) IMPLANT
CATH URET 5FR 70CM CONE TIP (BALLOONS) IMPLANT
CLOTH BEACON ORANGE TIMEOUT ST (SAFETY) ×4 IMPLANT
DRSG TEGADERM 2-3/8X2-3/4 SM (GAUZE/BANDAGES/DRESSINGS) IMPLANT
FIBER LASER FLEXIVA 1000 (UROLOGICAL SUPPLIES) IMPLANT
FIBER LASER FLEXIVA 365 (UROLOGICAL SUPPLIES) IMPLANT
FIBER LASER FLEXIVA 550 (UROLOGICAL SUPPLIES) IMPLANT
FIBER LASER TRAC TIP (UROLOGICAL SUPPLIES) ×4 IMPLANT
GLOVE BIO SURGEON STRL SZ7.5 (GLOVE) ×4 IMPLANT
GOWN STRL REUS W/ TWL XL LVL3 (GOWN DISPOSABLE) ×2 IMPLANT
GOWN STRL REUS W/TWL XL LVL3 (GOWN DISPOSABLE) ×2
GUIDEWIRE 0.038 PTFE COATED (WIRE) IMPLANT
GUIDEWIRE ANG ZIPWIRE 038X150 (WIRE) IMPLANT
GUIDEWIRE STR DUAL SENSOR (WIRE) ×4 IMPLANT
IV NS 1000ML (IV SOLUTION) ×2
IV NS 1000ML BAXH (IV SOLUTION) ×2 IMPLANT
IV NS IRRIG 3000ML ARTHROMATIC (IV SOLUTION) ×4 IMPLANT
KIT BALLIN UROMAX 15FX10 (LABEL) IMPLANT
KIT BALLN UROMAX 15FX4 (MISCELLANEOUS) IMPLANT
KIT BALLN UROMAX 26 75X4 (MISCELLANEOUS)
KIT ROOM TURNOVER WOR (KITS) ×4 IMPLANT
MANIFOLD NEPTUNE II (INSTRUMENTS) ×4 IMPLANT
NS IRRIG 500ML POUR BTL (IV SOLUTION) IMPLANT
PACK CYSTO (CUSTOM PROCEDURE TRAY) ×4 IMPLANT
SET HIGH PRES BAL DIL (LABEL)
SHEATH ACCESS URETERAL 38CM (SHEATH) ×4 IMPLANT
STENT URET 6FRX26 CONTOUR (STENTS) ×4 IMPLANT
SYRINGE IRR TOOMEY STRL 70CC (SYRINGE) IMPLANT
TUBE CONNECTING 12'X1/4 (SUCTIONS) ×1
TUBE CONNECTING 12X1/4 (SUCTIONS) ×3 IMPLANT

## 2015-10-18 NOTE — Discharge Instructions (Addendum)

## 2015-10-18 NOTE — H&P (Signed)
Office Visit Report     10/11/2015   --------------------------------------------------------------------------------   Gabriel Clark  MRN: [id number]  PRIMARY CARE:  Tally Joe, MD  DOB: 02-May-1960, 55 year old Male  REFERRING:    SSN: [id number]  PROVIDER:  Barron Alvine, M.D.    LOCATION:  [facility] [phone]   --------------------------------------------------------------------------------   CC: I am here for a post operative visit.  HPI: Gabriel Clark is a 55 year-old male established patient who is here for a post operative visit.  The surgery he had done was ESWL. His surgery was done 09/27/2015.   Gabriel Clark plans today for a postop visit status post lithotripsy. He was told that the stone was dense and it was unclear whether it actually fragmented. He has not passed any pieces from the lithotripsy. Fortunately he has done well clinically does not had any severe symptoms. He was noted earlier this month to have renal insufficiency with a creatinine of 2.1. This was repeated recently and was marginally better 1.9. He appeared to have normal renal function in the fall of 2015. This is presumptively due to ongoing obstruction of the left kidney. I KUB today continues to have evidence of a dense 7 mm stone in the proximal to mid left ureter. This appears unchanged from his pretreatment KUB. The stone does not appear to fragmented. He continues to have a 7 mm stone in the lower pole of the left kidney.     ALLERGIES: No Allergies    MEDICATIONS: Tamsulosin Hcl 0.4 mg capsule, ext release 24 hr  Aspirin 81 MG Oral Tablet Delayed Release Oral  CoQ10 CAPS Oral  Garlic  Krill Oil  L-Arginine Powder Does Not Apply  Levitra 20 MG Oral Tablet Oral  Multi-Day Vitamins TABS Oral  Ondansetron Odt 4 mg tablet,disintegrating 1 tablet PO Q 6 H PRN  Oxycodone-Acetaminophen 5 mg-325 mg tablet 1 tablet PO Q 4 H PRN  Simvastatin PO Daily  Super B Complex  Viagra PRN     GU PSH:  Cysto Uretero Remove Stone - 01/11/2014 Cystoscopy Insert Stent - 01/11/2014, 12/24/2013 Cystoscopy Ureteroscopy - 12/24/2013 Renal ESWL - 09/28/2015, 2013 Vasectomy - 2010      PSH Notes: Cystoscopy With Ureteroscopy With Removal Of Calculus, Cystoscopy With Insertion Of Ureteral Stent Left, Cystoscopy With Insertion Of Ureteral Stent Left, Cystoscopy With Ureteroscopy, Lithotripsy, Neuroplasty With Transposition Of Ulnar Nerve, Surgery Of Male Genitalia Vasectomy, Leg Repair   NON-GU PSH: Revise Ulnar Nerve At Elbow - 2012    GU PMH: Kidney Stone - 09/25/2015, Kidney Stone, Nephrolithiasis - 02/20/2015 Calculus Ureter, Ureteral stone - 01/17/2014 Anogenital (venereal) warts, Condyloma acuminata - 2014 ED, arterial insufficiency, Erectile dysfunction due to arterial insufficiency - 2014    NON-GU PMH: Encounter for general adult medical examination without abnormal findings, Encounter for preventive health examination - 02/20/2015 Personal history of other diseases of the circulatory system, History of hypertension - 2014 Personal history of other endocrine, nutritional and metabolic disease, History of hypercholesterolemia - 2014    FAMILY HISTORY: Family Health Status Number - Runs In Family Prostate Cancer - Runs In Family   SOCIAL HISTORY: Marital Status: Married Current Smoking Status: Patient does not smoke anymore.  Has never drank.  Drinks 2 caffeinated drinks per day.     Notes: Current every day smoker, Occupation:, Being A Social Drinker, Marital History - Currently Married, Tobacco Use   REVIEW OF SYSTEMS:    GU Review Male:   Patient reports get up at  night to urinate. Patient denies frequent urination, hard to postpone urination, burning/ pain with urination, leakage of urine, stream starts and stops, trouble starting your stream, have to strain to urinate , erection problems, and penile pain.  Gastrointestinal (Upper):   Patient reports indigestion/ heartburn. Patient  denies nausea and vomiting.  Gastrointestinal (Lower):   Patient denies diarrhea and constipation.  Constitutional:   Patient reports night sweats and fatigue. Patient denies fever and weight loss.  Skin:   Patient denies itching and skin rash/ lesion.  Eyes:   Patient denies blurred vision and double vision.  Ears/ Nose/ Throat:   Patient denies sore throat and sinus problems.  Hematologic/Lymphatic:   Patient denies swollen glands and easy bruising.  Cardiovascular:   Patient denies leg swelling and chest pains.  Respiratory:   Patient denies cough and shortness of breath.  Endocrine:   Patient denies excessive thirst.  Musculoskeletal:   Patient denies back pain and joint pain.  Neurological:   Patient denies headaches and dizziness.  Psychologic:   Patient denies depression and anxiety.   VITAL SIGNS:      10/11/2015 10:17 AM  BP 150/87 mmHg  Pulse 66 /min  Temperature 98.2 F / 37 C   PAST DATA REVIEWED:  Source Of History:  Patient   02/15/15 02/07/14 02/09/13 01/03/11 12/28/09 09/22/08 12/26/04 09/01/03  PSA  Total PSA 0.91  1.01  1.01  1.02  1.17  0.92  0.66  0.62     PROCEDURES:         KUB - 74000  A single view of the abdomen is obtained.               Urinalysis - 81003 Dipstick Dipstick Cont'd  Specimen: Voided Bilirubin: Neg  Color: Yellow Ketones: Neg  Appearance: Clear Blood: Neg  Specific Gravity: 1.020 Protein: Neg  pH: 6.0 Urobilinogen: 0.2  Glucose: Neg Nitrites: Neg    Leukocyte Esterase: Neg    ASSESSMENT:      ICD-10 Details  2 GU:   Calculus Ureter - N20.1   3   Kidney Stone - N20.0   1 NON-GU:   Other specified postprocedural states - Z98.89      PLAN:           Schedule Return Visit: 2 Weeks - Schedule Surgery          Document Letter(s):  Created for Patient: Clinical Summary         Notes:   successful ESWL.tthere is no need for intervention. We should proceed with scheduling for ureteroscopy with holmium laser lithotripsy. He  is interested in trying the renal stone treated simultaneously which is a reasonable option. We discussed the nature of that procedure and success rates for both stones. He would like to wait until the end of July so we will set him up tentatively for very early August. Hopefully creatinine will normalize with decompression of the left system. We would not expect any permanent renal insufficiency based on the time frames involved.   Cc Dr. Tally Joe    * Signed by Barron Alvine, M.D. on 10/11/15 at 11:20 AM (EDT)* * Signed by Barron Alvine, M.D. on 10/11/15 at 11:22 AM (EDT)*     The information contained in this medical record document is considered private and confidential patient information. This information can only be used for the medical diagnosis and/or medical services that are being provided by the patient's selected caregivers. This information can only be distributed outside of  the patient's care if the patient agrees and signs waivers of authorization for this information to be sent to an outside source or route.

## 2015-10-18 NOTE — Anesthesia Procedure Notes (Signed)
Procedure Name: LMA Insertion Date/Time: 10/18/2015 8:38 AM Performed by: Tyrone Nine Pre-anesthesia Checklist: Patient identified, Emergency Drugs available, Suction available, Patient being monitored and Timeout performed Patient Re-evaluated:Patient Re-evaluated prior to inductionOxygen Delivery Method: Circle system utilized Preoxygenation: Pre-oxygenation with 100% oxygen Intubation Type: IV induction Ventilation: Mask ventilation without difficulty LMA: LMA inserted LMA Size: 4.0 Number of attempts: 1 Placement Confirmation: positive ETCO2 and breath sounds checked- equal and bilateral Tube secured with: Tape Dental Injury: Teeth and Oropharynx as per pre-operative assessment

## 2015-10-18 NOTE — Transfer of Care (Signed)
Immediate Anesthesia Transfer of Care Note  Patient: Gabriel Clark  Procedure(s) Performed: Procedure(s) with comments: CYSTOSCOPY WITH RETROGRADE PYELOGRAM, URETEROSCOPY AND STENT PLACEMENT (Left) - 1 HOUR  CYSTOSCOPY WITH HOLMIUM LASER LITHOTRIPSY (Left) STONE EXTRACTION WITH BASKET (Right)  Patient Location: PACU  Anesthesia Type:General  Level of Consciousness: awake, alert , oriented and patient cooperative  Airway & Oxygen Therapy: Patient Spontanous Breathing and Patient connected to nasal cannula oxygen  Post-op Assessment: Report given to RN and Post -op Vital signs reviewed and stable  Post vital signs: Reviewed and stable  Last Vitals:  Vitals:   10/18/15 0703  BP: 127/86  Pulse: 70  Resp: 16  Temp: 36.9 C    Last Pain:  Vitals:   10/18/15 0703  TempSrc: Oral      Patients Stated Pain Goal: 6 (10/18/15 0719)  Complications: No apparent anesthesia complications

## 2015-10-18 NOTE — Interval H&P Note (Signed)
History and Physical Interval Note:  10/18/2015 8:31 AM  Gabriel Clark  has presented today for surgery, with the diagnosis of LEFT URETERAL AND LEFT RENAL CALCULI  The various methods of treatment have been discussed with the patient and family. After consideration of risks, benefits and other options for treatment, the patient has consented to  Procedure(s) with comments: CYSTOSCOPY WITH RETROGRADE PYELOGRAM, URETEROSCOPY AND STENT PLACEMENT (Left) - 1 HOUR  CYSTOSCOPY WITH HOLMIUM LASER LITHOTRIPSY (Left) as a surgical intervention .  The patient's history has been reviewed, patient examined, no change in status, stable for surgery.  I have reviewed the patient's chart and labs.  Questions were answered to the patient's satisfaction.      Updated physical exam:  Well-developed well-nourished male in no acute distress Respiratory: Normal effort Cardiac: Regular rate and rhythm Abdomen: Soft and nontender no CVA tenderness Neuro: Nonfocal Larenz Frasier S

## 2015-10-18 NOTE — Op Note (Signed)
Preoperative diagnosis: Left distal ureteral stone, left renal calculus Postoperative diagnosis: Same  Procedure: Cystoscopy, left retrograde pyelography, ureteroscopy, holmium laser lithotripsy, basketing of stone fragments, left double-J stent placement 26 cm 6 French without string   Surgeon: Valetta Fuller M.D.  Anesthesia: Gen.  Indications: Kavontae is currently 55 years of age. He has a prior history of nephrolithiasis. He was diagnosed recently with a 7 mm stone in his left ureter. He previously had had 27 mm renal calculi that were being observed. He also had a continued 7 mm lower pole left renal calculus. The patient was seen in our office and advised on options. He proceeded with ESWL which was performed on July 12 by Dr. Patsi Sears. There was poor fragmentation. On follow-up it appeared that the stone was unchanged in size or location. He never passed any fragments. He did continue have evidence of renal insufficiency with an elevated creatinine of approximately 1.9 which was definitely higher than his baseline and consistent with ongoing unilateral renal obstruction. Several options were discussed with him and he elected to proceed with definitive ureteroscopy. He also requested attempt and managing his left renal stone if possible. Risks benefits were fully disclosed. Informed consent obtained.     Technique and findings: Patient was brought to the operating room where he had successful induction of general anesthesia. He was placed in lithotomy position and prepped and draped in usual manner. An appropriate surgical timeout was performed. He did receive perioperative antibiotics and placement of PAS compression boots. Cystoscopically he had a unremarkable anterior and prostatic urethra. Bladder showed no pathology.   Retrograde pyelography was performed with an open-ended catheter. A substantial filling defect was noted approximately 3 cm above the ureterovesical junction. There was a marked  transition point and significant dilation proximal to the filling defect.   Guidewire was placed to the left renal pelvis. The distal ureter was then engaged with the rigid ureteroscope. Several centimeters up we found an impacted stone. With irrigation the stone and then did migrate proximally. We noted that the stone had fractured partly with ESWL and was the large fragment along with 2 small fragments. A 200  holmium laser fiber was then utilized. Power was 0.8 J 8 Hz. The stone was noted to be markedly dense and is likely to be a calcium oxalate monohydrate stone. We did fracture the stone into approximately a dozen pieces. 8 or 9 small fragments were all basket extracted. No obvious Occasions or problems occurred. The guidewire was confirmed to be in the left renal pelvis. I then attempted to place an access sheath to use a flexible ureteroscope to go after the left renal stone. Despite multiple careful attempts we were unable to advance the access sheath due to narrowing and tightness in that distal ureter where the stone had been located. We did not feel we could safely proceed and therefore we elected to abandon any attempts at getting the left renal stone. Over the guidewire we were able to easily place a 6 French 26 cm double-J stent which was confirmed to be in good position fluoroscopically as well as visually. We elected not to leave a dangle string. Lidocaine jelly was instilled and a B&O suppository was administered. Patient was brought to PACU in stable condition having had no obvious complications or problems.

## 2015-10-19 ENCOUNTER — Encounter (HOSPITAL_BASED_OUTPATIENT_CLINIC_OR_DEPARTMENT_OTHER): Payer: Self-pay | Admitting: Urology

## 2015-10-20 NOTE — Anesthesia Postprocedure Evaluation (Signed)
Anesthesia Post Note  Patient: Gabriel Clark  Procedure(s) Performed: Procedure(s) (LRB): CYSTOSCOPY WITH RETROGRADE PYELOGRAM, URETEROSCOPY AND STENT PLACEMENT (Left) CYSTOSCOPY WITH HOLMIUM LASER LITHOTRIPSY (Left) STONE EXTRACTION WITH BASKET (Left)  Patient location during evaluation: PACU Anesthesia Type: General Level of consciousness: awake and alert Pain management: pain level controlled Vital Signs Assessment: post-procedure vital signs reviewed and stable Respiratory status: spontaneous breathing, nonlabored ventilation, respiratory function stable and patient connected to nasal cannula oxygen Cardiovascular status: blood pressure returned to baseline and stable Postop Assessment: no signs of nausea or vomiting Anesthetic complications: no    Last Vitals:  Vitals:   10/18/15 1015 10/18/15 1145  BP: 131/75 123/74  Pulse: (!) 56 (!) 52  Resp: 19 16  Temp:      Last Pain:  Vitals:   10/19/15 1355  TempSrc:   PainSc: 0-No pain                 Phillips Grout

## 2015-10-25 ENCOUNTER — Other Ambulatory Visit: Payer: Self-pay | Admitting: Urology

## 2015-11-16 ENCOUNTER — Encounter (HOSPITAL_BASED_OUTPATIENT_CLINIC_OR_DEPARTMENT_OTHER): Payer: Self-pay | Admitting: *Deleted

## 2015-11-16 NOTE — Progress Notes (Signed)
NPO AFTER MN.  ARRIVE AT 0745.  NEEDS HG.  MAY TAKE OXYCODONE IF NEEDED W/ SIPS OF WATER AM DOS.

## 2015-11-21 MED FILL — URIBEL CAPSULE: 118 | 5 days supply | Qty: 15 | Fill #1

## 2015-11-22 ENCOUNTER — Ambulatory Visit (HOSPITAL_BASED_OUTPATIENT_CLINIC_OR_DEPARTMENT_OTHER): Payer: 59 | Admitting: Anesthesiology

## 2015-11-22 ENCOUNTER — Ambulatory Visit (HOSPITAL_BASED_OUTPATIENT_CLINIC_OR_DEPARTMENT_OTHER)
Admission: RE | Admit: 2015-11-22 | Discharge: 2015-11-22 | Disposition: A | Discharge status: Home / Self Care | Payer: 59 | Source: Ambulatory Visit | Attending: Urology | Admitting: Urology

## 2015-11-22 ENCOUNTER — Encounter (HOSPITAL_BASED_OUTPATIENT_CLINIC_OR_DEPARTMENT_OTHER): Payer: Self-pay | Admitting: Anesthesiology

## 2015-11-22 ENCOUNTER — Encounter (HOSPITAL_BASED_OUTPATIENT_CLINIC_OR_DEPARTMENT_OTHER)
Admission: RE | Disposition: A | Discharge status: Home / Self Care | Payer: Self-pay | Source: Ambulatory Visit | Attending: Urology

## 2015-11-22 DIAGNOSIS — N200 Calculus of kidney: Secondary | ICD-10-CM | POA: Diagnosis not present

## 2015-11-22 DIAGNOSIS — Z79899 Other long term (current) drug therapy: Secondary | ICD-10-CM | POA: Diagnosis not present

## 2015-11-22 DIAGNOSIS — I10 Essential (primary) hypertension: Secondary | ICD-10-CM | POA: Diagnosis not present

## 2015-11-22 DIAGNOSIS — E7800 Pure hypercholesterolemia, unspecified: Secondary | ICD-10-CM | POA: Insufficient documentation

## 2015-11-22 DIAGNOSIS — Z87891 Personal history of nicotine dependence: Secondary | ICD-10-CM | POA: Insufficient documentation

## 2015-11-22 DIAGNOSIS — Z7982 Long term (current) use of aspirin: Secondary | ICD-10-CM | POA: Insufficient documentation

## 2015-11-22 HISTORY — DX: Urgency of urination: R39.15

## 2015-11-22 HISTORY — PX: CYSTOSCOPY W/ URETERAL STENT REMOVAL: SHX1430

## 2015-11-22 HISTORY — PX: CYSTOSCOPY/URETEROSCOPY/HOLMIUM LASER: SHX6545

## 2015-11-22 LAB — POCT HEMOGLOBIN-HEMACUE: Hemoglobin: 13.9 g/dL (ref 13.0–17.0)

## 2015-11-22 SURGERY — CYSTOURETEROSCOPY, USING HOLMIUM LASER
Anesthesia: General | Site: Ureter | Laterality: Left

## 2015-11-22 MED ORDER — DEXAMETHASONE SODIUM PHOSPHATE 10 MG/ML IJ SOLN
INTRAMUSCULAR | Status: AC
  Filled 2015-11-22: qty 1

## 2015-11-22 MED ORDER — ONDANSETRON HCL 4 MG/2ML IJ SOLN
INTRAMUSCULAR | Status: DC | PRN
  Administered 2015-11-22: 4 mg via INTRAVENOUS

## 2015-11-22 MED ORDER — LIDOCAINE 2% (20 MG/ML) 5 ML SYRINGE
INTRAMUSCULAR | Status: DC | PRN
  Administered 2015-11-22: 80 mg via INTRAVENOUS

## 2015-11-22 MED ORDER — PROPOFOL 10 MG/ML IV BOLUS
INTRAVENOUS | Status: DC | PRN
  Administered 2015-11-22: 170 mg via INTRAVENOUS

## 2015-11-22 MED ORDER — MIDAZOLAM HCL 2 MG/2ML IJ SOLN
INTRAMUSCULAR | Status: AC
  Filled 2015-11-22: qty 2

## 2015-11-22 MED ORDER — ONDANSETRON HCL 4 MG/2ML IJ SOLN
INTRAMUSCULAR | Status: AC
  Filled 2015-11-22: qty 2

## 2015-11-22 MED ORDER — KETOROLAC TROMETHAMINE 30 MG/ML IJ SOLN
INTRAMUSCULAR | Status: DC | PRN
  Administered 2015-11-22: 30 mg via INTRAVENOUS

## 2015-11-22 MED ORDER — LACTATED RINGERS IV SOLN
INTRAVENOUS | Status: DC
  Administered 2015-11-22 (×2): via INTRAVENOUS
  Filled 2015-11-22: qty 1000

## 2015-11-22 MED ORDER — DEXAMETHASONE SODIUM PHOSPHATE 4 MG/ML IJ SOLN
INTRAMUSCULAR | Status: DC | PRN
  Administered 2015-11-22: 10 mg via INTRAVENOUS

## 2015-11-22 MED ORDER — ONDANSETRON HCL 4 MG/2ML IJ SOLN
4.0000 mg | Freq: Four times a day (QID) | INTRAMUSCULAR | Status: DC | PRN
  Filled 2015-11-22: qty 2

## 2015-11-22 MED ORDER — CEFAZOLIN SODIUM-DEXTROSE 2-4 GM/100ML-% IV SOLN
2.0000 g | INTRAVENOUS | Status: AC
  Administered 2015-11-22: 2 g via INTRAVENOUS
  Filled 2015-11-22: qty 100

## 2015-11-22 MED ORDER — CEFAZOLIN SODIUM-DEXTROSE 2-4 GM/100ML-% IV SOLN
INTRAVENOUS | Status: AC
  Filled 2015-11-22: qty 100

## 2015-11-22 MED ORDER — FENTANYL CITRATE (PF) 100 MCG/2ML IJ SOLN
INTRAMUSCULAR | Status: DC | PRN
Start: 2015-11-22 — End: 2015-11-22
  Administered 2015-11-22: 50 ug via INTRAVENOUS
  Administered 2015-11-22: 25 ug via INTRAVENOUS

## 2015-11-22 MED ORDER — SODIUM CHLORIDE 0.9 % IR SOLN
Status: DC | PRN
Start: 2015-11-22 — End: 2015-11-22
  Administered 2015-11-22: 4000 mL

## 2015-11-22 MED ORDER — PROPOFOL 10 MG/ML IV BOLUS
INTRAVENOUS | Status: AC
  Filled 2015-11-22: qty 40

## 2015-11-22 MED ORDER — MIDAZOLAM HCL 5 MG/5ML IJ SOLN
INTRAMUSCULAR | Status: DC | PRN
  Administered 2015-11-22: 2 mg via INTRAVENOUS

## 2015-11-22 MED ORDER — STERILE WATER FOR IRRIGATION IR SOLN
Status: DC | PRN
  Administered 2015-11-22: 500 mL

## 2015-11-22 MED ORDER — FENTANYL CITRATE (PF) 100 MCG/2ML IJ SOLN
25.0000 ug | INTRAMUSCULAR | Status: DC | PRN
  Filled 2015-11-22: qty 1

## 2015-11-22 MED ORDER — OXYCODONE HCL 5 MG/5ML PO SOLN
5.0000 mg | Freq: Once | ORAL | Status: DC | PRN
  Filled 2015-11-22: qty 5

## 2015-11-22 MED ORDER — OXYCODONE HCL 5 MG PO TABS
5.0000 mg | ORAL_TABLET | Freq: Once | ORAL | Status: DC | PRN
  Filled 2015-11-22: qty 1

## 2015-11-22 MED ORDER — FENTANYL CITRATE (PF) 100 MCG/2ML IJ SOLN
INTRAMUSCULAR | Status: AC
  Filled 2015-11-22: qty 4

## 2015-11-22 SURGICAL SUPPLY — 38 items
BAG DRAIN URO-CYSTO SKYTR STRL (DRAIN) ×3 IMPLANT
BASKET LASER NITINOL 1.9FR (BASKET) ×3 IMPLANT
BASKET STNLS GEMINI 4WIRE 3FR (BASKET) IMPLANT
BASKET STONE 1.7 NGAGE (UROLOGICAL SUPPLIES) ×3 IMPLANT
BASKET ZERO TIP NITINOL 2.4FR (BASKET) ×3 IMPLANT
BENZOIN TINCTURE PRP APPL 2/3 (GAUZE/BANDAGES/DRESSINGS) IMPLANT
CATH INTERMIT  6FR 70CM (CATHETERS) ×3 IMPLANT
CATH URET 5FR 28IN CONE TIP (BALLOONS)
CATH URET 5FR 28IN OPEN ENDED (CATHETERS) IMPLANT
CATH URET 5FR 70CM CONE TIP (BALLOONS) IMPLANT
CLOTH BEACON ORANGE TIMEOUT ST (SAFETY) ×6 IMPLANT
DRSG TEGADERM 2-3/8X2-3/4 SM (GAUZE/BANDAGES/DRESSINGS) IMPLANT
FIBER LASER FLEXIVA 1000 (UROLOGICAL SUPPLIES) IMPLANT
FIBER LASER FLEXIVA 365 (UROLOGICAL SUPPLIES) IMPLANT
FIBER LASER FLEXIVA 550 (UROLOGICAL SUPPLIES) IMPLANT
FIBER LASER TRAC TIP (UROLOGICAL SUPPLIES) IMPLANT
GLOVE BIO SURGEON STRL SZ7.5 (GLOVE) ×3 IMPLANT
GOWN STRL REUS W/ TWL LRG LVL3 (GOWN DISPOSABLE) ×1 IMPLANT
GOWN STRL REUS W/ TWL XL LVL3 (GOWN DISPOSABLE) ×1 IMPLANT
GOWN STRL REUS W/TWL LRG LVL3 (GOWN DISPOSABLE) ×5 IMPLANT
GOWN STRL REUS W/TWL XL LVL3 (GOWN DISPOSABLE) ×2
GUIDEWIRE 0.038 PTFE COATED (WIRE) IMPLANT
GUIDEWIRE ANG ZIPWIRE 038X150 (WIRE) ×3 IMPLANT
GUIDEWIRE STR DUAL SENSOR (WIRE) ×3 IMPLANT
IV NS 1000ML (IV SOLUTION) ×2
IV NS 1000ML BAXH (IV SOLUTION) ×1 IMPLANT
IV NS IRRIG 3000ML ARTHROMATIC (IV SOLUTION) ×3 IMPLANT
KIT BALLIN UROMAX 15FX10 (LABEL) IMPLANT
KIT BALLN UROMAX 15FX4 (MISCELLANEOUS) IMPLANT
KIT BALLN UROMAX 26 75X4 (MISCELLANEOUS)
KIT ROOM TURNOVER WOR (KITS) ×3 IMPLANT
MANIFOLD NEPTUNE II (INSTRUMENTS) ×3 IMPLANT
PACK CYSTO (CUSTOM PROCEDURE TRAY) ×3 IMPLANT
SET HIGH PRES BAL DIL (LABEL)
SHEATH ACCESS URETERAL 38CM (SHEATH) ×3 IMPLANT
TUBE CONNECTING 12'X1/4 (SUCTIONS) ×1
TUBE CONNECTING 12X1/4 (SUCTIONS) ×2 IMPLANT
WATER STERILE IRR 500ML POUR (IV SOLUTION) ×3 IMPLANT

## 2015-11-22 NOTE — Op Note (Signed)
Preoperative diagnosis: Left renal calculus Postoperative diagnosis: Same  Procedure: Cystoscopy, removal of left double-J stent, flexible ureteroscopy   Surgeon: Valetta Fulleravid S. Leatta Alewine M.D.  Anesthesia: Gen.  Indications: [name] had 2 known left renal calculi. He recently developed severe renal colic and was diagnosed with a migration of one of his renal stones into the ureter. He underwent an attempt at ESWL with poor fragmentation. The patient socially had ureteroscopy with holmium laser lithotripsy and removal of his ureteral stone. At that time we made an attempt to get his left renal stone but his ureter was too tight and would not allow safe placement of an access sheath. That reason we placed a double-J stent. Options were socially discussed with him which included removal of the stent and observation of the stone or another attempt at ureteroscopy. The patient wanted the stone to be removed if possible. He presents now for attempt at definitive stone removal of the left lower pole renal stone.     Technique and findings: Patient was brought the operating room where he had successful induction of general anesthesia. He was placed in lithotomy position and prepped and draped in usual manner. Appropriate surgical timeout was performed. With fluoroscopy one could see a 5 mm dense stone in the lower pole of the left renal unit. Cystoscopy revealed unremarkable urethra, prostatic urethra and bladder. Left double-J stent was indwelling and in good position. The stent was partially extracted and a guidewire placed to the left renal unit with fluoroscopic control. An access sheath was easily placed over the wire. A flexible digital ureteroscope was then utilized. In a minor lower pole calyx the 5 mm stone was encountered. Representative pictures were taken and it was reasonably well visualized. It was tucked behind the lip of a very minor calyx and visualization was difficult. I then spent approximately an hour  using a variety of different baskets to try to grab the stone and either extracted or potentially move it to a different position for laser lithotripsy. The angle was such that we were not able to direct a basket to the stone. The angles were so acute that we did not feel lithotripsy was a viable option. Despite attempting with 3 different baskets and spending over an hour trying to extract a stone we were unable to manipulate the flexible ureteroscope in a position where we could actually extract the stone with the basket. No obvious complications or problems occurred. Since he had had a double-J stent for 2-3 weeks we did not feel that repeat stenting was necessary especially given how easy the access sheath was inserted. The bladder was drained. The patient was brought to PACU in stable condition.

## 2015-11-22 NOTE — H&P (Signed)
Office Visit Report     10/25/2015   --------------------------------------------------------------------------------   Gabriel Clark  MRN: [id number]  PRIMARY CARE:  Tally Joe, MD  DOB: 11-Aug-1960, 55 year old Male  REFERRING:    SSN: [id number]  PROVIDER:  Barron Alvine, M.D.    LOCATION:  [facility] [phone]   --------------------------------------------------------------------------------   CC/HPI: [name] today for a follow-up visit and KUB. He had 2 stones in his left kidney and one recently migrated to the distal ureter. He underwent ESWL which failed to definitively manage the stone. After a period of observation and elected to proceed with ureteroscopy. I performed a procedure approximately a week ago. At the time of ureteroscopy we found an impacted stone in the distal ureter. It was actually in 3 fragments but had broken up or leaked with the ESWL. We were able to manage that stone successfully with holmium laser lithotripsy and basket removal of fragments. He requested an attempt to also definitively manage the remaining stone in his left kidney since it was quite dense. It was similar in size of the stone that it caused his clinical issues. We were unable to successfully pass an access sheath and therefore we were unable to perform flexible ureteroscopy and attempt to remove the left stone. He has had an indwelling stent now for a week and is tolerated that very nicely. A KUB today the stent appears to be in good position and the ureteral stone is no longer present. He continues to have a 6-7 mm stone in the lower pole was left kidney.     ALLERGIES: No Allergies    MEDICATIONS: Tamsulosin Hcl 0.4 mg capsule, ext release 24 hr  Aspirin 81 MG Oral Tablet Delayed Release Oral  CoQ10 CAPS Oral  Garlic  Krill Oil  Levitra 20 MG Oral Tablet Oral  Multi-Day Vitamins TABS Oral  Ondansetron Odt 4 mg tablet,disintegrating 1 tablet PO Q 6 H PRN   Oxycodone-Acetaminophen 5 mg-325 mg tablet 1 tablet PO Q 4 H PRN  Simvastatin PO Daily  Super B Complex  Uribel  Viagra PRN     GU PSH: Cysto Uretero Remove Stone - 01/11/2014 Cystoscopy Insert Stent - 01/11/2014, 12/24/2013 Cystoscopy Ureteroscopy - 12/24/2013 Renal ESWL - 09/28/2015, 2013 Vasectomy - 2010      PSH Notes: Cystoscopy With Ureteroscopy With Removal Of Calculus, Cystoscopy With Insertion Of Ureteral Stent Left, Cystoscopy With Insertion Of Ureteral Stent Left, Cystoscopy With Ureteroscopy, Lithotripsy, Neuroplasty With Transposition Of Ulnar Nerve, Surgery Of Male Genitalia Vasectomy, Leg Repair   NON-GU PSH: Revise Ulnar Nerve At Elbow - 2012    GU PMH: Kidney Stone - 09/25/2015, Kidney Stone, Nephrolithiasis - 02/20/2015 Calculus Ureter, Ureteral stone - 01/17/2014 Anogenital (venereal) warts, Condyloma acuminata - 2014 ED, arterial insufficiency, Erectile dysfunction due to arterial insufficiency - 2014    NON-GU PMH: Other specified postprocedural states - 10/11/2015 Encounter for general adult medical examination without abnormal findings, Encounter for preventive health examination - 02/20/2015 Personal history of other diseases of the circulatory system, History of hypertension - 2014 Personal history of other endocrine, nutritional and metabolic disease, History of hypercholesterolemia - 2014    FAMILY HISTORY: Family Health Status Number - Runs In Family Prostate Cancer - Runs In Family   SOCIAL HISTORY: Marital Status: Married Current Smoking Status: Patient does not smoke anymore.  Has never drank.  Drinks 2 caffeinated drinks per day.    REVIEW OF SYSTEMS:    GU Review Male:  Patient denies frequent urination, hard to postpone urination, burning/ pain with urination, get up at night to urinate, leakage of urine, stream starts and stops, trouble starting your stream, have to strain to urinate , erection problems, and penile pain.  Gastrointestinal  (Upper):   Patient denies nausea, vomiting, and indigestion/ heartburn.  Gastrointestinal (Lower):   Patient denies diarrhea and constipation.  Constitutional:   Patient denies fever, night sweats, weight loss, and fatigue.  Skin:   Patient denies skin rash/ lesion and itching.  Eyes:   Patient denies blurred vision and double vision.  Ears/ Nose/ Throat:   Patient denies sore throat and sinus problems.  Hematologic/Lymphatic:   Patient denies swollen glands and easy bruising.  Cardiovascular:   Patient denies leg swelling and chest pains.  Respiratory:   Patient denies cough and shortness of breath.  Endocrine:   Patient denies excessive thirst.  Musculoskeletal:   Patient reports back pain. Patient denies joint pain.  Neurological:   Patient denies headaches and dizziness.  Psychologic:   Patient denies anxiety and depression.   VITAL SIGNS:      10/25/2015 08:33 AM  BP 149/89 mmHg  Pulse 66 /min  Temperature 97.9 F / 37 C   Well-developed well-nourished male in no acute distress Respiratory: Normal effort  Cardiac: Regular rate and rhythm Abdomen: Soft nontender no palpable masses no CVA tenderness.  Genitourinary: Normal external genitalia Extremities: No edema/tenderness  PAST DATA REVIEWED:  Source Of History:  Patient   02/15/15 02/07/14 02/09/13 01/03/11 12/28/09 09/22/08 12/26/04 09/01/03  PSA  Total PSA 0.91  1.01  1.01  1.02  1.17  0.92  0.66  0.62     PROCEDURES:         KUB - 74000  A single view of the abdomen is obtained.               Urinalysis w/Scope - 81001 Dipstick Dipstick Cont'd Micro  Specimen: Voided Bilirubin: Neg WBC/hpf: 0-5/hpf  Color: Green Ketones: Neg RBC/hpf: 3-10/hpf  Appearance: Clear Blood: Trace Bacteria: NS (Not Seen)  Specific Gravity: 1.025 Protein: Trace Cystals: NS (Not Seen)  pH: 6.0 Urobilinogen: 0.2 Casts: NS (Not Seen)  Glucose: Neg Nitrites: Neg Trichomonas: Not Present    Leukocyte Esterase: Neg Mucous: Not Present       Epithelial Cells: NS (Not Seen)      Yeast: NS (Not Seen)      Sperm: Not Present    ASSESSMENT:      ICD-10 Details  1 GU:   Kidney Stone - N20.0      PLAN:           Orders Labs BMP          Schedule Return Visit: 1-2 Weeks - Schedule Surgery          Document Letter(s):  Created for Patient: Clinical Summary         Notes:   Jabe And I discussed options today. It would certainly be reasonable to proceed with cystoscopy and removal of his double-J stent and ongoing observation of his left lower pole stone. He however would like definitive treatment which is certainly reasonable given his history and also the density of the stone. It does not appear likely that he would pass stones spontaneously and ESWL has not been effective. He would like another attempt at flexible ureteroscopy. He has both ARE much better now that he has had a double-J stent in which does result in ureteral dilation. We will  attempt to get him on the schedule sometime in the next several weeks for a repeat ureteroscopy with attempt at getting his left lower pole stone. We talked about success rates and options. He does need a reassessment of his renal function today to be sure that it has improved.   Cc Tally Joeavid Swayne, M.D.    * Signed by Barron Alvineavid Angelic Schnelle, M.D. on 10/25/15 at 8:48 AM (EDT)*     The information contained in this medical record document is considered private and confidential patient information. This information can only be used for the medical diagnosis and/or medical services that are being provided by the patient's selected caregivers. This information can only be distributed outside of the patient's care if the patient agrees and signs waivers of authorization for this information to be sent to an outside source or route.

## 2015-11-22 NOTE — Transfer of Care (Signed)
Immediate Anesthesia Transfer of Care Note  Patient: Gabriel Clark  Procedure(s) Performed: Procedure(s): CYSTOSCOPY/URETEROSCOPY (Left) CYSTOSCOPY WITH STENT REMOVAL (Left)  Patient Location: PACU  Anesthesia Type:General  Level of Consciousness: awake, alert , oriented and patient cooperative  Airway & Oxygen Therapy: Patient Spontanous Breathing and Patient connected to nasal cannula oxygen  Post-op Assessment: Report given to RN and Post -op Vital signs reviewed and stable  Post vital signs: Reviewed and stable  Last Vitals:  Vitals:   11/22/15 0802  BP: 132/79  Pulse: 85  Resp: 16  Temp: 36.4 C    Last Pain:  Vitals:   11/22/15 0802  TempSrc: Oral      Patients Stated Pain Goal: 6 (11/22/15 [id number])  Complications: No apparent anesthesia complications

## 2015-11-22 NOTE — Anesthesia Procedure Notes (Signed)
Procedure Name: LMA Insertion Date/Time: 11/22/2015 9:02 AM Performed by: Tyrone NineSAUVE, Josafat Enrico F Pre-anesthesia Checklist: Patient identified, Timeout performed, Emergency Drugs available, Suction available and Patient being monitored Patient Re-evaluated:Patient Re-evaluated prior to inductionOxygen Delivery Method: Circle system utilized Preoxygenation: Pre-oxygenation with 100% oxygen Intubation Type: IV induction Ventilation: Mask ventilation without difficulty LMA: LMA inserted LMA Size: 4.0 Number of attempts: 1 Placement Confirmation: positive ETCO2 and breath sounds checked- equal and bilateral Tube secured with: Tape Dental Injury: Teeth and Oropharynx as per pre-operative assessment

## 2015-11-22 NOTE — Discharge Instructions (Addendum)
°Post Anesthesia Home Care Instructions ° °Activity: °Get plenty of rest for the remainder of the day. A responsible adult should stay with you for 24 hours following the procedure.  °For the next 24 hours, DO NOT: °-Drive a car °-Operate machinery °-Drink alcoholic beverages °-Take any medication unless instructed by your physician °-Make any legal decisions or sign important papers. ° °Meals: °Start with liquid foods such as gelatin or soup. Progress to regular foods as tolerated. Avoid greasy, spicy, heavy foods. If nausea and/or vomiting occur, drink only clear liquids until the nausea and/or vomiting subsides. Call your physician if vomiting continues. ° °Special Instructions/Symptoms: °Your throat may feel dry or sore from the anesthesia or the breathing tube placed in your throat during surgery. If this causes discomfort, gargle with warm salt water. The discomfort should disappear within 24 hours. ° °If you had a scopolamine patch placed behind your ear for the management of post- operative nausea and/or vomiting: ° °1. The medication in the patch is effective for 72 hours, after which it should be removed.  Wrap patch in a tissue and discard in the trash. Wash hands thoroughly with soap and water. °2. You may remove the patch earlier than 72 hours if you experience unpleasant side effects which may include dry mouth, dizziness or visual disturbances. °3. Avoid touching the patch. Wash your hands with soap and water after contact with the patch. °  °[facility] °[phone] °Post Ureteroscopy With or Without Stent Instructions ° °Definitions: ° °Ureter: The duct that transports urine from the kidney to the bladder. °Stent:   A plastic hollow tube that is placed into the ureter, from the kidney to the                 bladder to prevent the ureter from swelling shut. ° °GENERAL INSTRUCTIONS: ° °Despite the fact that no skin incisions were used, the area around the ureter and bladder is raw  and irritated. The stent is a foreign body which will further irritate the bladder wall. This irritation is manifested by increased frequency of urination, both day and night, and by an increase in the urge to urinate. In some, the urge to urinate is present almost always. Sometimes the urge is strong enough that you may not be able to stop yourself from urinating. The only real cure is to remove the stent and then give time for the bladder wall to heal which can't be done until the danger of the ureter swelling shut has passed, which varies. ° °You may see some blood in your urine while the stent is in place and a few days afterwards. Do not be alarmed, even if the urine was clear for a while. Get off your feet and drink lots of fluids until clearing occurs. If you start to pass clots or don't improve, call us. ° °DIET: °You may return to your normal diet immediately. Because of the raw surface of your bladder, alcohol, spicy foods, acid type foods and drinks with caffeine may cause irritation or frequency and should be used in moderation. To keep your urine flowing freely and to avoid constipation, drink plenty of fluids during the day ( 8-10 glasses ). °Tip: Avoid cranberry juice because it is very acidic. ° °ACTIVITY: °Your physical activity doesn't need to be restricted. However, if you are very active, you may see some blood in your urine. We suggest that you reduce your activity under these circumstances until the bleeding has stopped. ° °BOWELS: °  It is important to keep your bowels regular during the postoperative period. Straining with bowel movements can cause bleeding. A bowel movement every other day is reasonable. Use a mild laxative if needed, such as Milk of Magnesia 2-3 tablespoons, or 2 Dulcolax tablets. Call if you continue to have problems. If you have been taking narcotics for pain, before, during or after your surgery, you may be constipated. Take a laxative if necessary.   MEDICATION: You  should resume your pre-surgery medications unless told not to. In addition you will often be given an antibiotic to prevent infection. These should be taken as prescribed until the bottles are finished unless you are having an unusual reaction to one of the drugs.  PROBLEMS YOU SHOULD REPORT TO US:  Fevers over 100.5 Fahrenheit.  Heavy bleeding, or clots ( See above notes about blood in urine ).  Inability to urinate.  Drug reactions ( hives, rash, nausea, vomiting, diarrhea ).  Severe burning or pain with urination that is not improving.  FOLLOW-UP: You will need a follow-up appointment to monitor your progress. Call for this appointment at the number listed above.  We will plan on a follow-up appointment in 3-6 weeks and sooner if you're having any difficulty problems

## 2015-11-22 NOTE — Anesthesia Postprocedure Evaluation (Signed)
Anesthesia Post Note  Patient: Gabriel Clark  Procedure(s) Performed: Procedure(s) (LRB): CYSTOSCOPY/URETEROSCOPY (Left) CYSTOSCOPY WITH STENT REMOVAL (Left)  Patient location during evaluation: PACU Anesthesia Type: General Level of consciousness: awake and alert and patient cooperative Pain management: pain level controlled Vital Signs Assessment: post-procedure vital signs reviewed and stable Respiratory status: spontaneous breathing and respiratory function stable Cardiovascular status: stable Anesthetic complications: no    Last Vitals:  Vitals:   11/22/15 1100 11/22/15 1115  BP: 117/82   Pulse: 64 71  Resp: 17 16  Temp:      Last Pain:  Vitals:   11/22/15 1115  TempSrc:   PainSc: 0-No pain                 Emiyah Spraggins S

## 2015-11-22 NOTE — Anesthesia Preprocedure Evaluation (Addendum)
Anesthesia Evaluation  Patient identified by MRN, date of birth, ID band Patient awake    Reviewed: Allergy & Precautions, NPO status , Patient's Chart, lab work & pertinent test results  History of Anesthesia Complications (+) PONV  Airway Mallampati: II   Neck ROM: full    Dental  (+) Teeth Intact, Dental Advisory Given   Pulmonary former smoker,    breath sounds clear to auscultation       Cardiovascular negative cardio ROS   Rhythm:regular Rate:Normal     Neuro/Psych    GI/Hepatic   Endo/Other    Renal/GU Left renal calculus     Musculoskeletal   Abdominal   Peds  Hematology   Anesthesia Other Findings   Reproductive/Obstetrics                           Anesthesia Physical Anesthesia Plan  ASA: II  Anesthesia Plan: General   Post-op Pain Management:    Induction: Intravenous  Airway Management Planned: LMA  Additional Equipment:   Intra-op Plan:   Post-operative Plan:   Informed Consent: I have reviewed the patients History and Physical, chart, labs and discussed the procedure including the risks, benefits and alternatives for the proposed anesthesia with the patient or authorized representative who has indicated his/her understanding and acceptance.     Plan Discussed with: CRNA, Anesthesiologist and Surgeon  Anesthesia Plan Comments:         Anesthesia Quick Evaluation

## 2015-11-22 NOTE — Interval H&P Note (Signed)
History and Physical Interval Note:  11/22/2015 10:46 AM  Gabriel Clark  has presented today for surgery, with the diagnosis of LEFT RENAL STONE  The various methods of treatment have been discussed with the patient and family. After consideration of risks, benefits and other options for treatment, the patient has consented to  Procedure(s): CYSTOSCOPY/URETEROSCOPY (Left) CYSTOSCOPY WITH STENT REMOVAL (Left) as a surgical intervention .  The patient's history has been reviewed, patient examined, no change in status, stable for surgery.  I have reviewed the patient's chart and labs.  Questions were answered to the patient's satisfaction.     Alexsandria Kivett S

## 2015-11-23 ENCOUNTER — Encounter (HOSPITAL_BASED_OUTPATIENT_CLINIC_OR_DEPARTMENT_OTHER): Payer: Self-pay | Admitting: Urology

## 2016-04-02 DIAGNOSIS — E291 Testicular hypofunction: Secondary | ICD-10-CM | POA: Diagnosis not present

## 2016-04-02 DIAGNOSIS — R7301 Impaired fasting glucose: Secondary | ICD-10-CM | POA: Diagnosis not present

## 2016-04-02 DIAGNOSIS — J069 Acute upper respiratory infection, unspecified: Secondary | ICD-10-CM | POA: Diagnosis not present

## 2016-04-02 DIAGNOSIS — E7800 Pure hypercholesterolemia, unspecified: Secondary | ICD-10-CM | POA: Diagnosis not present

## 2016-04-10 DIAGNOSIS — W19XXXA Unspecified fall, initial encounter: Secondary | ICD-10-CM | POA: Diagnosis not present

## 2016-04-10 DIAGNOSIS — S00511A Abrasion of lip, initial encounter: Secondary | ICD-10-CM | POA: Diagnosis not present

## 2016-06-11 DIAGNOSIS — K219 Gastro-esophageal reflux disease without esophagitis: Secondary | ICD-10-CM | POA: Diagnosis not present

## 2016-06-11 DIAGNOSIS — R1013 Epigastric pain: Secondary | ICD-10-CM | POA: Diagnosis not present

## 2016-07-07 IMAGING — CR DG CHEST 2V
2 series · 2 of 2 positions shown · non-contrast
Comparison: None.

CLINICAL DATA: Preoperative films.  Patient for cystoscopy.

EXAM:
CHEST  2 VIEW

[w chest pa]
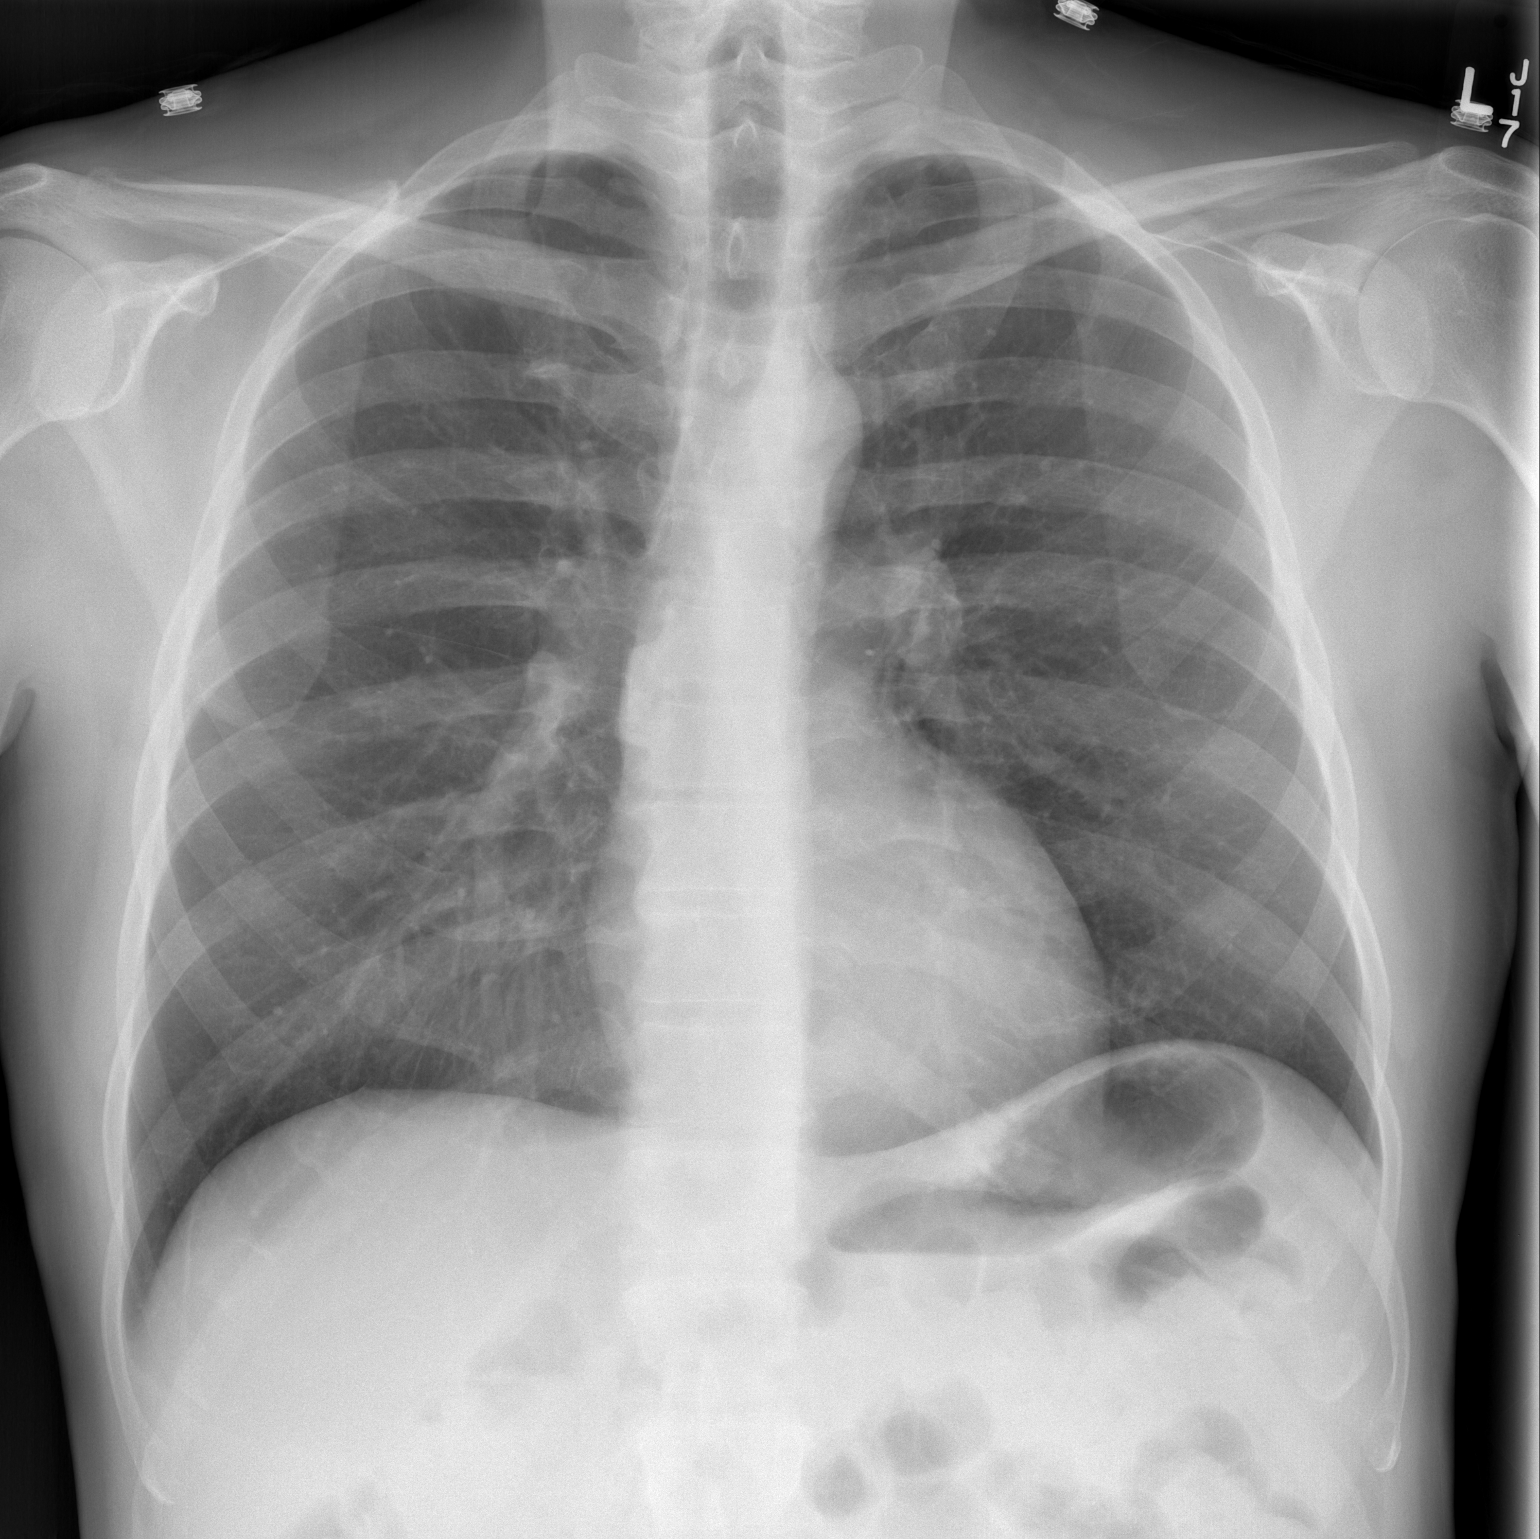

[w chest lat]
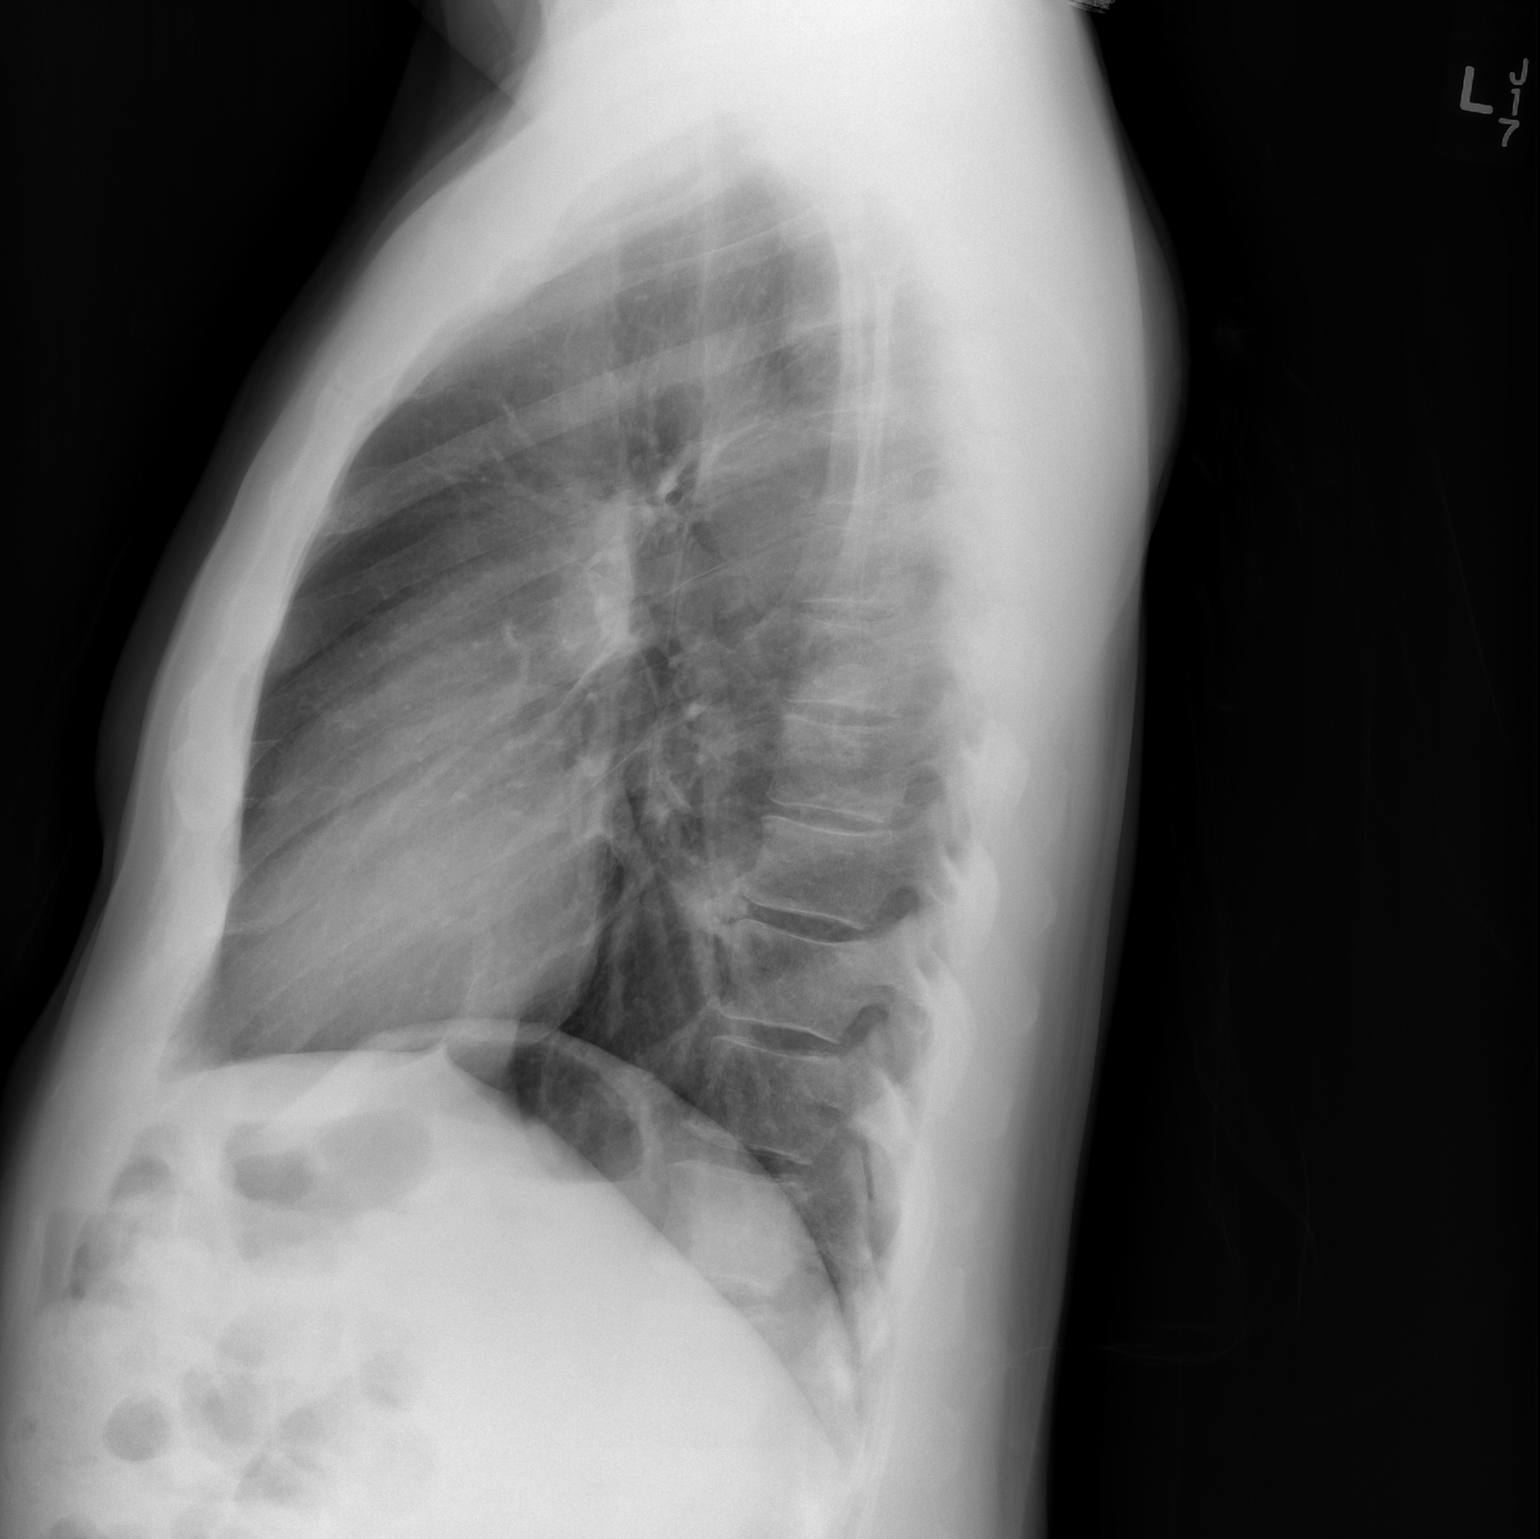

[2 of 2 positions shown; findings below may reference images not displayed]

FINDINGS: Heart size and mediastinal contours are within normal limits. Both
lungs are clear. Visualized skeletal structures are unremarkable.
IMPRESSION: Negative examination.

## 2016-11-05 DIAGNOSIS — E291 Testicular hypofunction: Secondary | ICD-10-CM | POA: Diagnosis not present

## 2016-11-05 DIAGNOSIS — Z125 Encounter for screening for malignant neoplasm of prostate: Secondary | ICD-10-CM | POA: Diagnosis not present

## 2016-11-05 DIAGNOSIS — K210 Gastro-esophageal reflux disease with esophagitis: Secondary | ICD-10-CM | POA: Diagnosis not present

## 2016-11-05 DIAGNOSIS — R7303 Prediabetes: Secondary | ICD-10-CM | POA: Diagnosis not present

## 2016-11-05 DIAGNOSIS — E7800 Pure hypercholesterolemia, unspecified: Secondary | ICD-10-CM | POA: Diagnosis not present

## 2016-11-05 DIAGNOSIS — Z0000 Encounter for general adult medical examination without abnormal findings: Secondary | ICD-10-CM | POA: Diagnosis not present

## 2016-12-02 DIAGNOSIS — R1013 Epigastric pain: Secondary | ICD-10-CM | POA: Diagnosis not present

## 2016-12-12 DIAGNOSIS — E291 Testicular hypofunction: Secondary | ICD-10-CM | POA: Diagnosis not present

## 2016-12-16 DIAGNOSIS — K210 Gastro-esophageal reflux disease with esophagitis: Secondary | ICD-10-CM | POA: Diagnosis not present

## 2016-12-16 DIAGNOSIS — R12 Heartburn: Secondary | ICD-10-CM | POA: Diagnosis not present

## 2016-12-16 DIAGNOSIS — K2981 Duodenitis with bleeding: Secondary | ICD-10-CM | POA: Diagnosis not present

## 2016-12-16 DIAGNOSIS — K2930 Chronic superficial gastritis without bleeding: Secondary | ICD-10-CM | POA: Diagnosis not present

## 2017-01-24 DIAGNOSIS — M7712 Lateral epicondylitis, left elbow: Secondary | ICD-10-CM | POA: Diagnosis not present

## 2017-01-24 DIAGNOSIS — M25522 Pain in left elbow: Secondary | ICD-10-CM | POA: Diagnosis not present

## 2017-01-31 DIAGNOSIS — M7712 Lateral epicondylitis, left elbow: Secondary | ICD-10-CM | POA: Diagnosis not present

## 2017-02-05 DIAGNOSIS — M7712 Lateral epicondylitis, left elbow: Secondary | ICD-10-CM | POA: Diagnosis not present

## 2017-02-11 DIAGNOSIS — M7712 Lateral epicondylitis, left elbow: Secondary | ICD-10-CM | POA: Diagnosis not present

## 2017-02-13 DIAGNOSIS — M7712 Lateral epicondylitis, left elbow: Secondary | ICD-10-CM | POA: Diagnosis not present

## 2017-02-17 DIAGNOSIS — N400 Enlarged prostate without lower urinary tract symptoms: Secondary | ICD-10-CM | POA: Diagnosis not present

## 2017-02-17 DIAGNOSIS — M7712 Lateral epicondylitis, left elbow: Secondary | ICD-10-CM | POA: Diagnosis not present

## 2017-02-20 DIAGNOSIS — M7712 Lateral epicondylitis, left elbow: Secondary | ICD-10-CM | POA: Diagnosis not present

## 2017-02-26 DIAGNOSIS — N200 Calculus of kidney: Secondary | ICD-10-CM | POA: Diagnosis not present

## 2017-02-26 DIAGNOSIS — R3912 Poor urinary stream: Secondary | ICD-10-CM | POA: Diagnosis not present

## 2017-02-26 DIAGNOSIS — R3914 Feeling of incomplete bladder emptying: Secondary | ICD-10-CM | POA: Diagnosis not present

## 2017-03-03 DIAGNOSIS — M7712 Lateral epicondylitis, left elbow: Secondary | ICD-10-CM | POA: Diagnosis not present

## 2017-03-06 DIAGNOSIS — M7712 Lateral epicondylitis, left elbow: Secondary | ICD-10-CM | POA: Diagnosis not present

## 2017-03-12 DIAGNOSIS — M25522 Pain in left elbow: Secondary | ICD-10-CM | POA: Diagnosis not present

## 2017-03-12 DIAGNOSIS — M7712 Lateral epicondylitis, left elbow: Secondary | ICD-10-CM | POA: Diagnosis not present

## 2017-05-15 DIAGNOSIS — D696 Thrombocytopenia, unspecified: Secondary | ICD-10-CM | POA: Diagnosis not present

## 2017-05-15 DIAGNOSIS — E291 Testicular hypofunction: Secondary | ICD-10-CM | POA: Diagnosis not present

## 2017-05-15 DIAGNOSIS — E7800 Pure hypercholesterolemia, unspecified: Secondary | ICD-10-CM | POA: Diagnosis not present

## 2017-05-15 DIAGNOSIS — R7303 Prediabetes: Secondary | ICD-10-CM | POA: Diagnosis not present

## 2017-08-28 DIAGNOSIS — N200 Calculus of kidney: Secondary | ICD-10-CM | POA: Diagnosis not present

## 2017-09-22 DIAGNOSIS — T1501XA Foreign body in cornea, right eye, initial encounter: Secondary | ICD-10-CM | POA: Diagnosis not present

## 2017-09-22 DIAGNOSIS — S0501XA Injury of conjunctiva and corneal abrasion without foreign body, right eye, initial encounter: Secondary | ICD-10-CM | POA: Diagnosis not present

## 2017-09-23 DIAGNOSIS — S0501XD Injury of conjunctiva and corneal abrasion without foreign body, right eye, subsequent encounter: Secondary | ICD-10-CM | POA: Diagnosis not present

## 2017-11-05 DIAGNOSIS — M25561 Pain in right knee: Secondary | ICD-10-CM | POA: Diagnosis not present

## 2017-11-05 DIAGNOSIS — M7631 Iliotibial band syndrome, right leg: Secondary | ICD-10-CM | POA: Diagnosis not present

## 2017-11-20 DIAGNOSIS — M25561 Pain in right knee: Secondary | ICD-10-CM | POA: Diagnosis not present

## 2017-11-25 DIAGNOSIS — M25561 Pain in right knee: Secondary | ICD-10-CM | POA: Diagnosis not present

## 2017-11-27 DIAGNOSIS — M25561 Pain in right knee: Secondary | ICD-10-CM | POA: Diagnosis not present

## 2017-12-02 DIAGNOSIS — M25561 Pain in right knee: Secondary | ICD-10-CM | POA: Diagnosis not present

## 2017-12-04 DIAGNOSIS — M25561 Pain in right knee: Secondary | ICD-10-CM | POA: Diagnosis not present

## 2017-12-09 DIAGNOSIS — M25561 Pain in right knee: Secondary | ICD-10-CM | POA: Diagnosis not present

## 2017-12-11 DIAGNOSIS — M25561 Pain in right knee: Secondary | ICD-10-CM | POA: Diagnosis not present

## 2017-12-16 DIAGNOSIS — M7631 Iliotibial band syndrome, right leg: Secondary | ICD-10-CM | POA: Diagnosis not present

## 2017-12-26 DIAGNOSIS — Z0000 Encounter for general adult medical examination without abnormal findings: Secondary | ICD-10-CM | POA: Diagnosis not present

## 2017-12-26 DIAGNOSIS — E7800 Pure hypercholesterolemia, unspecified: Secondary | ICD-10-CM | POA: Diagnosis not present

## 2017-12-26 DIAGNOSIS — N529 Male erectile dysfunction, unspecified: Secondary | ICD-10-CM | POA: Diagnosis not present

## 2017-12-26 DIAGNOSIS — Z23 Encounter for immunization: Secondary | ICD-10-CM | POA: Diagnosis not present

## 2017-12-26 DIAGNOSIS — E291 Testicular hypofunction: Secondary | ICD-10-CM | POA: Diagnosis not present

## 2018-02-26 DIAGNOSIS — Z23 Encounter for immunization: Secondary | ICD-10-CM | POA: Diagnosis not present

## 2018-04-14 DIAGNOSIS — R7303 Prediabetes: Secondary | ICD-10-CM | POA: Diagnosis not present

## 2018-04-14 DIAGNOSIS — I10 Essential (primary) hypertension: Secondary | ICD-10-CM | POA: Diagnosis not present

## 2018-04-14 DIAGNOSIS — E7800 Pure hypercholesterolemia, unspecified: Secondary | ICD-10-CM | POA: Diagnosis not present

## 2018-04-15 IMAGING — DX DG ABDOMEN 1V
1 series · 1 of 1 positions shown · non-contrast
Comparison: 09/25/2015

CLINICAL DATA: Left side ureteral stone

EXAM:
ABDOMEN - 1 VIEW

[abdomen kub]
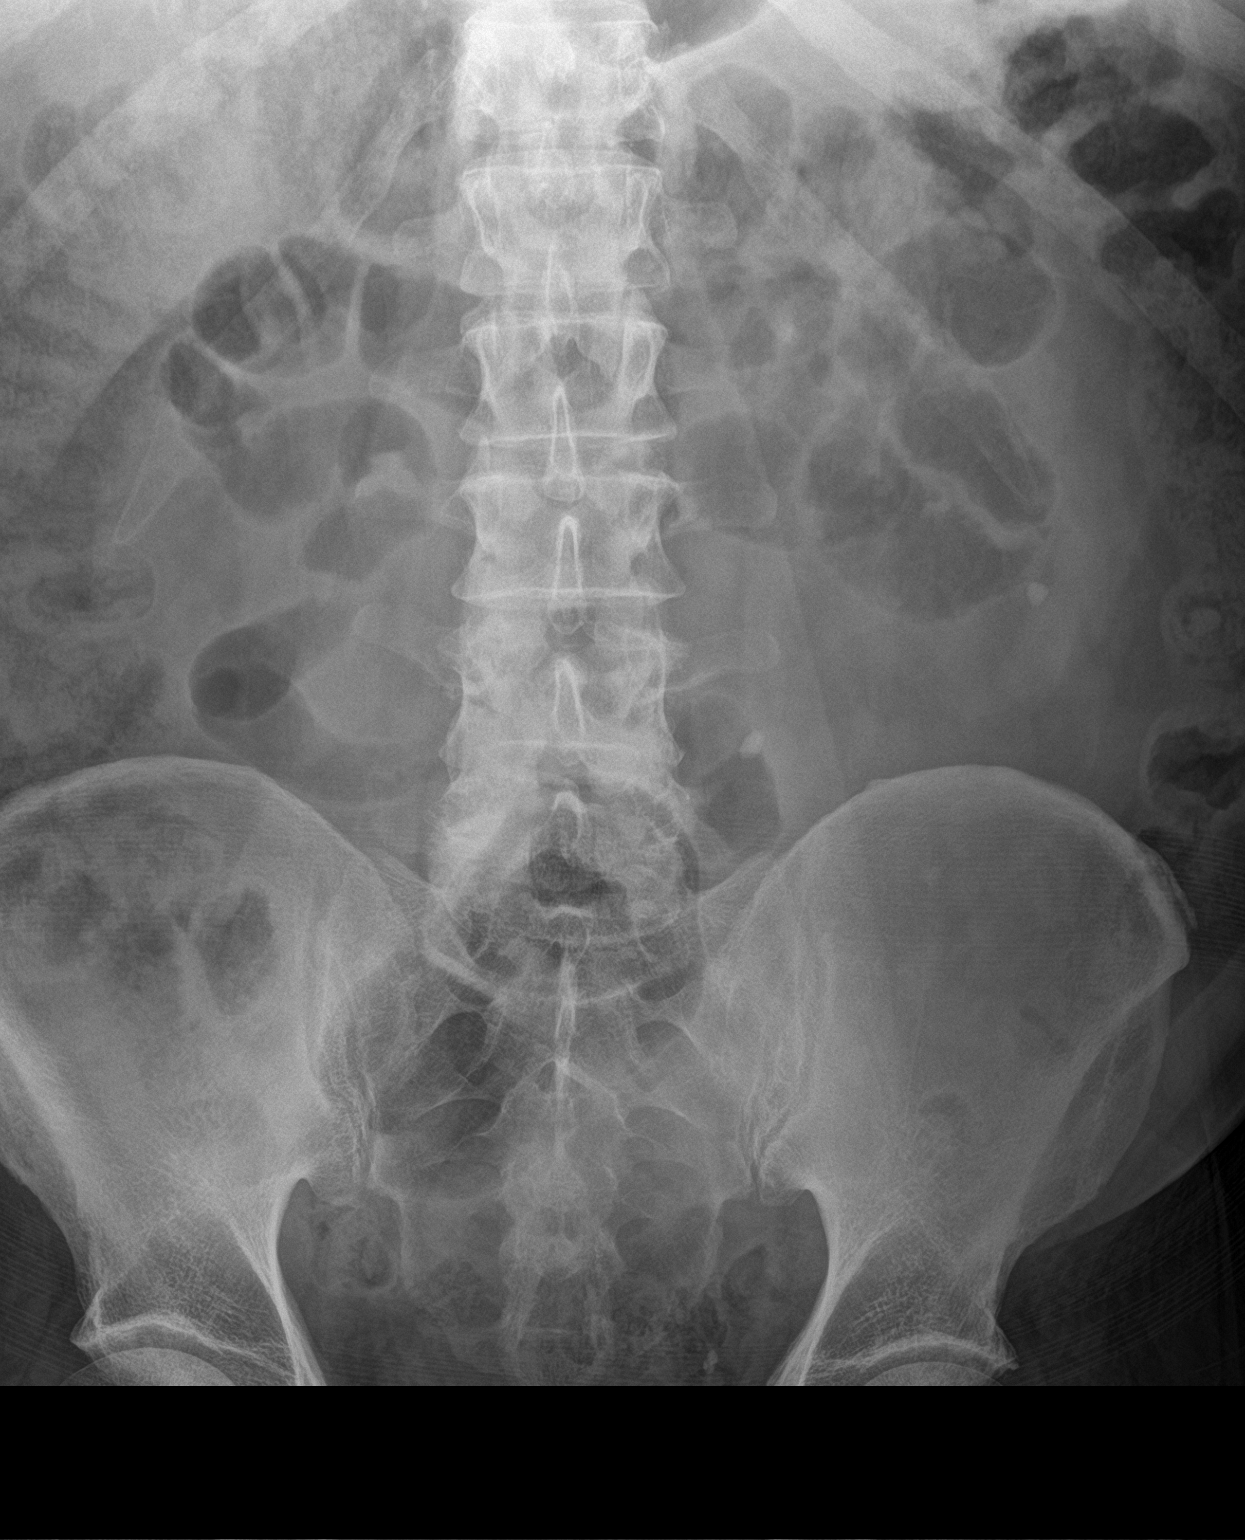

[1 of 1 positions shown; findings below may reference images not displayed]

FINDINGS: Mild gaseous distended small bowel loops mid abdomen probable mild
ileus. Abundant stool noted in right colon transverse colon.
Moderate stool noted descending colon. Again noted calculus in lower
pole region of the left kidney measures 7 mm. Again noted a left
ureteral calculus measures about 8 mm with a lower position in mid
left ureter about the level of lower endplate of L4 vertebral body.
IMPRESSION: Mild gaseous distended small bowel loops mid abdomen probable ileus.
Left nephrolithiasis. Left ureteral calculus measures about 8 mm mid
left ureter above the level lower endplate of L4 vertebral body.

## 2018-06-19 DIAGNOSIS — R7303 Prediabetes: Secondary | ICD-10-CM | POA: Diagnosis not present

## 2018-06-19 DIAGNOSIS — E7800 Pure hypercholesterolemia, unspecified: Secondary | ICD-10-CM | POA: Diagnosis not present

## 2018-06-19 DIAGNOSIS — I10 Essential (primary) hypertension: Secondary | ICD-10-CM | POA: Diagnosis not present

## 2018-06-19 DIAGNOSIS — E291 Testicular hypofunction: Secondary | ICD-10-CM | POA: Diagnosis not present

## 2018-06-30 DIAGNOSIS — E291 Testicular hypofunction: Secondary | ICD-10-CM | POA: Diagnosis not present

## 2018-06-30 DIAGNOSIS — E7800 Pure hypercholesterolemia, unspecified: Secondary | ICD-10-CM | POA: Diagnosis not present

## 2018-06-30 DIAGNOSIS — R7303 Prediabetes: Secondary | ICD-10-CM | POA: Diagnosis not present

## 2018-08-06 DIAGNOSIS — E291 Testicular hypofunction: Secondary | ICD-10-CM | POA: Diagnosis not present

## 2018-08-25 DIAGNOSIS — R3914 Feeling of incomplete bladder emptying: Secondary | ICD-10-CM | POA: Diagnosis not present

## 2018-08-25 DIAGNOSIS — N401 Enlarged prostate with lower urinary tract symptoms: Secondary | ICD-10-CM | POA: Diagnosis not present

## 2018-12-28 DIAGNOSIS — Z0000 Encounter for general adult medical examination without abnormal findings: Secondary | ICD-10-CM | POA: Diagnosis not present

## 2018-12-28 DIAGNOSIS — R42 Dizziness and giddiness: Secondary | ICD-10-CM | POA: Diagnosis not present

## 2018-12-28 DIAGNOSIS — E7800 Pure hypercholesterolemia, unspecified: Secondary | ICD-10-CM | POA: Diagnosis not present

## 2018-12-28 DIAGNOSIS — R7303 Prediabetes: Secondary | ICD-10-CM | POA: Diagnosis not present

## 2018-12-28 DIAGNOSIS — I10 Essential (primary) hypertension: Secondary | ICD-10-CM | POA: Diagnosis not present

## 2019-01-15 DIAGNOSIS — R7303 Prediabetes: Secondary | ICD-10-CM | POA: Diagnosis not present

## 2019-01-15 DIAGNOSIS — E7800 Pure hypercholesterolemia, unspecified: Secondary | ICD-10-CM | POA: Diagnosis not present

## 2019-01-15 DIAGNOSIS — Z23 Encounter for immunization: Secondary | ICD-10-CM | POA: Diagnosis not present

## 2019-01-15 DIAGNOSIS — Z125 Encounter for screening for malignant neoplasm of prostate: Secondary | ICD-10-CM | POA: Diagnosis not present

## 2019-01-15 DIAGNOSIS — E291 Testicular hypofunction: Secondary | ICD-10-CM | POA: Diagnosis not present

## 2019-06-15 DIAGNOSIS — E291 Testicular hypofunction: Secondary | ICD-10-CM | POA: Diagnosis not present

## 2019-06-15 DIAGNOSIS — I10 Essential (primary) hypertension: Secondary | ICD-10-CM | POA: Diagnosis not present

## 2019-06-15 DIAGNOSIS — R7303 Prediabetes: Secondary | ICD-10-CM | POA: Diagnosis not present

## 2019-06-15 DIAGNOSIS — E7800 Pure hypercholesterolemia, unspecified: Secondary | ICD-10-CM | POA: Diagnosis not present

## 2019-10-11 DIAGNOSIS — N401 Enlarged prostate with lower urinary tract symptoms: Secondary | ICD-10-CM | POA: Diagnosis not present

## 2019-10-11 DIAGNOSIS — R3912 Poor urinary stream: Secondary | ICD-10-CM | POA: Diagnosis not present

## 2020-01-04 DIAGNOSIS — R7303 Prediabetes: Secondary | ICD-10-CM | POA: Diagnosis not present

## 2020-01-04 DIAGNOSIS — E7800 Pure hypercholesterolemia, unspecified: Secondary | ICD-10-CM | POA: Diagnosis not present

## 2020-01-04 DIAGNOSIS — Z0000 Encounter for general adult medical examination without abnormal findings: Secondary | ICD-10-CM | POA: Diagnosis not present

## 2020-01-04 DIAGNOSIS — Z125 Encounter for screening for malignant neoplasm of prostate: Secondary | ICD-10-CM | POA: Diagnosis not present

## 2020-01-04 DIAGNOSIS — I10 Essential (primary) hypertension: Secondary | ICD-10-CM | POA: Diagnosis not present

## 2020-01-04 DIAGNOSIS — E291 Testicular hypofunction: Secondary | ICD-10-CM | POA: Diagnosis not present

## 2020-01-27 DIAGNOSIS — M79645 Pain in left finger(s): Secondary | ICD-10-CM | POA: Diagnosis not present

## 2020-01-27 DIAGNOSIS — M1812 Unilateral primary osteoarthritis of first carpometacarpal joint, left hand: Secondary | ICD-10-CM | POA: Diagnosis not present

## 2020-02-14 DIAGNOSIS — I10 Essential (primary) hypertension: Secondary | ICD-10-CM | POA: Diagnosis not present

## 2020-02-14 DIAGNOSIS — R7303 Prediabetes: Secondary | ICD-10-CM | POA: Diagnosis not present

## 2020-02-14 DIAGNOSIS — E7800 Pure hypercholesterolemia, unspecified: Secondary | ICD-10-CM | POA: Diagnosis not present

## 2020-03-22 DIAGNOSIS — B349 Viral infection, unspecified: Secondary | ICD-10-CM | POA: Diagnosis not present

## 2020-03-22 DIAGNOSIS — U071 COVID-19: Secondary | ICD-10-CM | POA: Diagnosis not present

## 2020-03-22 DIAGNOSIS — R0981 Nasal congestion: Secondary | ICD-10-CM | POA: Diagnosis not present

## 2020-03-22 DIAGNOSIS — R059 Cough, unspecified: Secondary | ICD-10-CM | POA: Diagnosis not present

## 2020-05-03 ENCOUNTER — Telehealth: Payer: Self-pay | Admitting: Gastroenterology

## 2020-05-03 NOTE — Telephone Encounter (Signed)
Good afternoon Dr. Lavon Paganini, we received a referral for patient to have a colonoscopy.  Last colon was in 2016.  Will be sending records to you.  Can you please review and advise on scheduling?  Thank you.

## 2020-05-04 ENCOUNTER — Encounter: Payer: Self-pay | Admitting: Gastroenterology

## 2020-05-04 NOTE — Telephone Encounter (Signed)
Ok to schedule direct colonoscopy for h/o colon polyps. Thanks 

## 2020-05-04 NOTE — Telephone Encounter (Signed)
Patient scheduled for 07/17/2020 for procedure.

## 2020-05-12 DIAGNOSIS — R7303 Prediabetes: Secondary | ICD-10-CM | POA: Diagnosis not present

## 2020-05-12 DIAGNOSIS — E7800 Pure hypercholesterolemia, unspecified: Secondary | ICD-10-CM | POA: Diagnosis not present

## 2020-05-12 DIAGNOSIS — I10 Essential (primary) hypertension: Secondary | ICD-10-CM | POA: Diagnosis not present

## 2020-05-15 DIAGNOSIS — E782 Mixed hyperlipidemia: Secondary | ICD-10-CM | POA: Diagnosis not present

## 2020-05-15 DIAGNOSIS — I10 Essential (primary) hypertension: Secondary | ICD-10-CM | POA: Diagnosis not present

## 2020-05-15 DIAGNOSIS — R7303 Prediabetes: Secondary | ICD-10-CM | POA: Diagnosis not present

## 2020-06-14 DIAGNOSIS — E782 Mixed hyperlipidemia: Secondary | ICD-10-CM | POA: Diagnosis not present

## 2020-06-14 DIAGNOSIS — E291 Testicular hypofunction: Secondary | ICD-10-CM | POA: Diagnosis not present

## 2020-06-14 DIAGNOSIS — I10 Essential (primary) hypertension: Secondary | ICD-10-CM | POA: Diagnosis not present

## 2020-06-14 DIAGNOSIS — R7303 Prediabetes: Secondary | ICD-10-CM | POA: Diagnosis not present

## 2020-07-03 ENCOUNTER — Other Ambulatory Visit: Payer: Self-pay

## 2020-07-03 ENCOUNTER — Ambulatory Visit (AMBULATORY_SURGERY_CENTER): Payer: Self-pay | Admitting: *Deleted

## 2020-07-03 VITALS — Ht 75.0 in | Wt 210.0 lb

## 2020-07-03 DIAGNOSIS — Z86010 Personal history of colonic polyps: Secondary | ICD-10-CM

## 2020-07-03 MED ORDER — SUTAB 1479-225-188 MG PO TABS: 1.0000 | ORAL_TABLET | ORAL | 0 refills | Status: DC

## 2020-07-03 NOTE — Progress Notes (Signed)
Patient is here in-person for PV. Patient denies any allergies to eggs or soy. Patient denies any problems with anesthesia/sedation. PONV. Patient denies any oxygen use at home. Patient denies taking any diet/weight loss medications or blood thinners. Patient is not being treated for MRSA or C-diff. Patient is aware of our care-partner policy and Covid-19 safety protocol. EMMI education assigned to the patient for the procedure, sent to MyChart.    Pt was unable to tolerate and drink the liquid prep last colon. sutab given-Prep Prescription coupon given to the patient.

## 2020-07-12 ENCOUNTER — Encounter: Payer: Self-pay | Admitting: Gastroenterology

## 2020-07-17 ENCOUNTER — Other Ambulatory Visit: Payer: Self-pay

## 2020-07-17 ENCOUNTER — Encounter: Payer: Self-pay | Admitting: Gastroenterology

## 2020-07-17 ENCOUNTER — Ambulatory Visit (AMBULATORY_SURGERY_CENTER): Payer: BC Managed Care – PPO | Admitting: Gastroenterology

## 2020-07-17 VITALS — BP 101/64 | HR 67 | Temp 97.3°F | Resp 13 | Ht 75.0 in | Wt 210.0 lb

## 2020-07-17 DIAGNOSIS — Z86010 Personal history of colonic polyps: Secondary | ICD-10-CM | POA: Diagnosis not present

## 2020-07-17 DIAGNOSIS — Z1211 Encounter for screening for malignant neoplasm of colon: Secondary | ICD-10-CM | POA: Diagnosis not present

## 2020-07-17 MED ORDER — SODIUM CHLORIDE 0.9 % IV SOLN: 500.0000 mL | Freq: Once | INTRAVENOUS | Status: AC

## 2020-07-17 NOTE — Progress Notes (Signed)
0855 Ephedrine 10 mg given IV due to low BP, MD updated.   

## 2020-07-17 NOTE — Patient Instructions (Signed)
YOU HAD AN ENDOSCOPIC PROCEDURE TODAY AT THE [facility]:   Refer to the procedure report that was given to you for any specific questions about what was found during the examination.  If the procedure report does not answer your questions, please call your gastroenterologist to clarify.  If you requested that your care partner not be given the details of your procedure findings, then the procedure report has been included in a sealed envelope for you to review at your convenience later.  YOU SHOULD EXPECT: Some feelings of bloating in the abdomen. Passage of more gas than usual.  Walking can help get rid of the air that was put into your GI tract during the procedure and reduce the bloating. If you had a lower endoscopy (such as a colonoscopy or flexible sigmoidoscopy) you may notice spotting of blood in your stool or on the toilet paper. If you underwent a bowel prep for your procedure, you may not have a normal bowel movement for a few days.  Please Note:  You might notice some irritation and congestion in your nose or some drainage.  This is from the oxygen used during your procedure.  There is no need for concern and it should clear up in a day or so.  SYMPTOMS TO REPORT IMMEDIATELY:   Following lower endoscopy (colonoscopy or flexible sigmoidoscopy):  Excessive amounts of blood in the stool  Significant tenderness or worsening of abdominal pains  Swelling of the abdomen that is new, acute  Fever of 100F or higher   For urgent or emergent issues, a gastroenterologist can be reached at any hour by calling [phone]. Do not use MyChart messaging for urgent concerns.    DIET:  We do recommend a small meal at first, but then you may proceed to your regular diet.  Drink plenty of fluids but you should avoid alcoholic beverages for 24 hours.  MEDICATIONS: Continue present medications.  FOLLOW UP: For future colonoscopy, you will require an extended preparation.  Please see  handouts given to you by your recovery nurse.  Thank you for allowing [place] to provide for your healthcare needs today.  ACTIVITY:  You should plan to take it easy for the rest of today and you should NOT DRIVE or use heavy machinery until tomorrow (because of the sedation medicines used during the test).    FOLLOW UP: Our staff will call the number listed on your records 48-72 hours following your procedure to check on you and address any questions or concerns that you may have regarding the information given to you following your procedure. If we do not reach you, we will leave a message.  We will attempt to reach you two times.  During this call, we will ask if you have developed any symptoms of COVID 19. If you develop any symptoms (ie: fever, flu-like symptoms, shortness of breath, cough etc.) before then, please call [phone].  If you test positive for Covid 19 in the 2 weeks post procedure, please call and report this information to [place].    If any biopsies were taken you will be contacted by phone or by letter within the next 1-3 weeks.  Please call us at [phone] if you have not heard about the biopsies in 3 weeks.    SIGNATURES/CONFIDENTIALITY: You and/or your care partner have signed paperwork which will be entered into your electronic medical record.  These signatures attest to the fact that that the information above on your After Visit  Summary has been reviewed and is understood.  Full responsibility of the confidentiality of this discharge information lies with you and/or your care-partner. 

## 2020-07-17 NOTE — Op Note (Signed)
[facility] Patient Name: Gabriel Clark Procedure Date: 07/17/2020 8:45 AM MRN: [id number] Endoscopist: Napoleon Form , MD Age: 60 Referring MD:  Date of Birth: 29-Mar-1960 Gender: Male Account #: [id number] Procedure:                Colonoscopy Indications:              High risk colon cancer surveillance: Personal                            history of colonic polyps, Surveillance: Personal                            history of adenomatous polyps on last colonoscopy >                            5 years ago Medicines:                Monitored Anesthesia Care Procedure:                Pre-Anesthesia Assessment:                           - Prior to the procedure, a History and Physical                            was performed, and patient medications and                            allergies were reviewed. The patient's tolerance of                            previous anesthesia was also reviewed. The risks                            and benefits of the procedure and the sedation                            options and risks were discussed with the patient.                            All questions were answered, and informed consent                            was obtained. Prior Anticoagulants: The patient has                            taken no previous anticoagulant or antiplatelet                            agents. ASA Grade Assessment: II - A patient with                            mild systemic disease. After reviewing the risks  and benefits, the patient was deemed in                            satisfactory condition to undergo the procedure.                           After obtaining informed consent, the colonoscope                            was passed under direct vision. Throughout the                            procedure, the patient's blood pressure, pulse, and                            oxygen saturations were monitored continuously. The                             Olympus PFC-[id number]) Colonoscope was                            introduced through the anus and advanced to the the                            cecum, identified by appendiceal orifice and                            ileocecal valve. The colonoscopy was performed                            without difficulty. The patient tolerated the                            procedure well. The quality of the bowel                            preparation was adequate to identify polyps 6 mm                            and larger in size. The ileocecal valve,                            appendiceal orifice, and rectum were photographed. Scope In: 8:55:54 AM Scope Out: 9:10:29 AM Scope Withdrawal Time: 0 hours 10 minutes 29 seconds  Total Procedure Duration: 0 hours 14 minutes 35 seconds  Findings:                 The perianal and digital rectal examinations were                            normal.                           A few small-mouthed diverticula were found in the  sigmoid colon.                           Non-bleeding external and internal hemorrhoids were                            found during retroflexion. The hemorrhoids were                            medium-sized.                           The exam was otherwise without abnormality. Complications:            No immediate complications. Estimated Blood Loss:     Estimated blood loss was minimal. Estimated blood                            loss was minimal. Impression:               - Diverticulosis in the sigmoid colon.                           - Non-bleeding external and internal hemorrhoids.                           - The examination was otherwise normal.                           - No specimens collected. Recommendation:           - Patient has a contact number available for                            emergencies. The signs and symptoms of potential                             delayed complications were discussed with the                            patient. Return to normal activities tomorrow.                            Written discharge instructions were provided to the                            patient.                           - Resume previous diet.                           - Continue present medications.                           - Repeat colonoscopy in 5 years for surveillance.                           -  For future colonoscopy the patient will require                            an extended preparation. If there are any                            questions, please contact the gastroenterologist. Napoleon Form, MD 07/17/2020 9:17:25 AM This report has been signed electronically.

## 2020-07-17 NOTE — Progress Notes (Signed)
Report given to PACU, vss 

## 2020-07-19 ENCOUNTER — Telehealth: Payer: Self-pay

## 2020-07-19 NOTE — Telephone Encounter (Signed)
LVM

## 2020-10-02 DIAGNOSIS — N400 Enlarged prostate without lower urinary tract symptoms: Secondary | ICD-10-CM | POA: Diagnosis not present

## 2020-10-02 DIAGNOSIS — N200 Calculus of kidney: Secondary | ICD-10-CM | POA: Diagnosis not present

## 2020-10-25 DIAGNOSIS — N200 Calculus of kidney: Secondary | ICD-10-CM | POA: Diagnosis not present

## 2020-10-25 DIAGNOSIS — N201 Calculus of ureter: Secondary | ICD-10-CM | POA: Diagnosis not present

## 2020-10-27 DIAGNOSIS — N200 Calculus of kidney: Secondary | ICD-10-CM | POA: Diagnosis not present

## 2020-11-01 DIAGNOSIS — L821 Other seborrheic keratosis: Secondary | ICD-10-CM | POA: Diagnosis not present

## 2020-11-16 DIAGNOSIS — M1812 Unilateral primary osteoarthritis of first carpometacarpal joint, left hand: Secondary | ICD-10-CM | POA: Diagnosis not present

## 2020-11-16 DIAGNOSIS — M79645 Pain in left finger(s): Secondary | ICD-10-CM | POA: Diagnosis not present

## 2020-12-06 DIAGNOSIS — N200 Calculus of kidney: Secondary | ICD-10-CM | POA: Diagnosis not present

## 2020-12-26 DIAGNOSIS — M1812 Unilateral primary osteoarthritis of first carpometacarpal joint, left hand: Secondary | ICD-10-CM | POA: Diagnosis not present

## 2020-12-26 DIAGNOSIS — M13132 Monoarthritis, not elsewhere classified, left wrist: Secondary | ICD-10-CM | POA: Diagnosis not present

## 2021-01-08 DIAGNOSIS — I10 Essential (primary) hypertension: Secondary | ICD-10-CM | POA: Diagnosis not present

## 2021-01-08 DIAGNOSIS — Z125 Encounter for screening for malignant neoplasm of prostate: Secondary | ICD-10-CM | POA: Diagnosis not present

## 2021-01-08 DIAGNOSIS — R7303 Prediabetes: Secondary | ICD-10-CM | POA: Diagnosis not present

## 2021-01-08 DIAGNOSIS — Z4789 Encounter for other orthopedic aftercare: Secondary | ICD-10-CM | POA: Diagnosis not present

## 2021-01-08 DIAGNOSIS — Z0000 Encounter for general adult medical examination without abnormal findings: Secondary | ICD-10-CM | POA: Diagnosis not present

## 2021-01-10 DIAGNOSIS — Z23 Encounter for immunization: Secondary | ICD-10-CM | POA: Diagnosis not present

## 2021-01-10 DIAGNOSIS — R7303 Prediabetes: Secondary | ICD-10-CM | POA: Diagnosis not present

## 2021-01-10 DIAGNOSIS — E291 Testicular hypofunction: Secondary | ICD-10-CM | POA: Diagnosis not present

## 2021-01-10 DIAGNOSIS — I10 Essential (primary) hypertension: Secondary | ICD-10-CM | POA: Diagnosis not present

## 2021-01-10 DIAGNOSIS — E782 Mixed hyperlipidemia: Secondary | ICD-10-CM | POA: Diagnosis not present

## 2021-01-10 DIAGNOSIS — Z0000 Encounter for general adult medical examination without abnormal findings: Secondary | ICD-10-CM | POA: Diagnosis not present

## 2021-01-23 DIAGNOSIS — L821 Other seborrheic keratosis: Secondary | ICD-10-CM | POA: Diagnosis not present

## 2021-01-23 DIAGNOSIS — A630 Anogenital (venereal) warts: Secondary | ICD-10-CM | POA: Diagnosis not present

## 2021-01-23 DIAGNOSIS — D1801 Hemangioma of skin and subcutaneous tissue: Secondary | ICD-10-CM | POA: Diagnosis not present

## 2021-01-23 DIAGNOSIS — L812 Freckles: Secondary | ICD-10-CM | POA: Diagnosis not present

## 2021-01-23 DIAGNOSIS — D225 Melanocytic nevi of trunk: Secondary | ICD-10-CM | POA: Diagnosis not present

## 2021-01-24 DIAGNOSIS — M25642 Stiffness of left hand, not elsewhere classified: Secondary | ICD-10-CM | POA: Diagnosis not present

## 2021-01-24 DIAGNOSIS — Z4789 Encounter for other orthopedic aftercare: Secondary | ICD-10-CM | POA: Diagnosis not present

## 2021-01-31 DIAGNOSIS — M79642 Pain in left hand: Secondary | ICD-10-CM | POA: Diagnosis not present

## 2021-01-31 DIAGNOSIS — M25642 Stiffness of left hand, not elsewhere classified: Secondary | ICD-10-CM | POA: Diagnosis not present

## 2021-02-14 DIAGNOSIS — M25642 Stiffness of left hand, not elsewhere classified: Secondary | ICD-10-CM | POA: Diagnosis not present

## 2021-02-21 DIAGNOSIS — M25642 Stiffness of left hand, not elsewhere classified: Secondary | ICD-10-CM | POA: Diagnosis not present

## 2021-02-28 DIAGNOSIS — M1812 Unilateral primary osteoarthritis of first carpometacarpal joint, left hand: Secondary | ICD-10-CM | POA: Diagnosis not present

## 2021-02-28 DIAGNOSIS — M25642 Stiffness of left hand, not elsewhere classified: Secondary | ICD-10-CM | POA: Diagnosis not present

## 2021-03-06 DIAGNOSIS — A630 Anogenital (venereal) warts: Secondary | ICD-10-CM | POA: Diagnosis not present

## 2021-03-07 DIAGNOSIS — M25642 Stiffness of left hand, not elsewhere classified: Secondary | ICD-10-CM | POA: Diagnosis not present

## 2021-03-16 DIAGNOSIS — M25642 Stiffness of left hand, not elsewhere classified: Secondary | ICD-10-CM | POA: Diagnosis not present

## 2021-03-21 DIAGNOSIS — M25642 Stiffness of left hand, not elsewhere classified: Secondary | ICD-10-CM | POA: Diagnosis not present

## 2021-03-26 DIAGNOSIS — M25642 Stiffness of left hand, not elsewhere classified: Secondary | ICD-10-CM | POA: Diagnosis not present

## 2021-04-02 DIAGNOSIS — M25642 Stiffness of left hand, not elsewhere classified: Secondary | ICD-10-CM | POA: Diagnosis not present

## 2021-04-09 DIAGNOSIS — M25642 Stiffness of left hand, not elsewhere classified: Secondary | ICD-10-CM | POA: Diagnosis not present

## 2021-04-16 DIAGNOSIS — M25642 Stiffness of left hand, not elsewhere classified: Secondary | ICD-10-CM | POA: Diagnosis not present

## 2021-04-18 DIAGNOSIS — Z4789 Encounter for other orthopedic aftercare: Secondary | ICD-10-CM | POA: Diagnosis not present

## 2021-05-08 DIAGNOSIS — R7303 Prediabetes: Secondary | ICD-10-CM | POA: Diagnosis not present

## 2021-05-08 DIAGNOSIS — D7589 Other specified diseases of blood and blood-forming organs: Secondary | ICD-10-CM | POA: Diagnosis not present

## 2021-05-08 DIAGNOSIS — E7800 Pure hypercholesterolemia, unspecified: Secondary | ICD-10-CM | POA: Diagnosis not present

## 2021-05-08 DIAGNOSIS — I10 Essential (primary) hypertension: Secondary | ICD-10-CM | POA: Diagnosis not present

## 2021-05-08 DIAGNOSIS — E291 Testicular hypofunction: Secondary | ICD-10-CM | POA: Diagnosis not present

## 2021-05-30 DIAGNOSIS — M25532 Pain in left wrist: Secondary | ICD-10-CM | POA: Diagnosis not present

## 2021-05-30 DIAGNOSIS — Z4789 Encounter for other orthopedic aftercare: Secondary | ICD-10-CM | POA: Diagnosis not present

## 2021-06-20 DIAGNOSIS — Z4789 Encounter for other orthopedic aftercare: Secondary | ICD-10-CM | POA: Diagnosis not present

## 2021-06-20 DIAGNOSIS — M25532 Pain in left wrist: Secondary | ICD-10-CM | POA: Diagnosis not present

## 2021-12-19 DIAGNOSIS — L812 Freckles: Secondary | ICD-10-CM | POA: Diagnosis not present

## 2021-12-19 DIAGNOSIS — A630 Anogenital (venereal) warts: Secondary | ICD-10-CM | POA: Diagnosis not present

## 2021-12-19 DIAGNOSIS — L821 Other seborrheic keratosis: Secondary | ICD-10-CM | POA: Diagnosis not present

## 2021-12-19 DIAGNOSIS — D1801 Hemangioma of skin and subcutaneous tissue: Secondary | ICD-10-CM | POA: Diagnosis not present

## 2021-12-19 DIAGNOSIS — B078 Other viral warts: Secondary | ICD-10-CM | POA: Diagnosis not present

## 2021-12-27 DIAGNOSIS — N400 Enlarged prostate without lower urinary tract symptoms: Secondary | ICD-10-CM | POA: Diagnosis not present

## 2021-12-27 DIAGNOSIS — Z87442 Personal history of urinary calculi: Secondary | ICD-10-CM | POA: Diagnosis not present

## 2022-01-15 DIAGNOSIS — E785 Hyperlipidemia, unspecified: Secondary | ICD-10-CM | POA: Diagnosis not present

## 2022-01-15 DIAGNOSIS — Z79899 Other long term (current) drug therapy: Secondary | ICD-10-CM | POA: Diagnosis not present

## 2022-01-15 DIAGNOSIS — E291 Testicular hypofunction: Secondary | ICD-10-CM | POA: Diagnosis not present

## 2022-01-15 DIAGNOSIS — Z125 Encounter for screening for malignant neoplasm of prostate: Secondary | ICD-10-CM | POA: Diagnosis not present

## 2022-01-15 DIAGNOSIS — Z23 Encounter for immunization: Secondary | ICD-10-CM | POA: Diagnosis not present

## 2022-01-15 DIAGNOSIS — R7303 Prediabetes: Secondary | ICD-10-CM | POA: Diagnosis not present

## 2022-01-15 DIAGNOSIS — D696 Thrombocytopenia, unspecified: Secondary | ICD-10-CM | POA: Diagnosis not present

## 2022-01-15 DIAGNOSIS — I10 Essential (primary) hypertension: Secondary | ICD-10-CM | POA: Diagnosis not present

## 2022-01-15 DIAGNOSIS — Z0000 Encounter for general adult medical examination without abnormal findings: Secondary | ICD-10-CM | POA: Diagnosis not present

## 2022-06-27 DIAGNOSIS — M25562 Pain in left knee: Secondary | ICD-10-CM | POA: Diagnosis not present

## 2022-07-03 DIAGNOSIS — R7303 Prediabetes: Secondary | ICD-10-CM | POA: Diagnosis not present

## 2022-07-03 DIAGNOSIS — N529 Male erectile dysfunction, unspecified: Secondary | ICD-10-CM | POA: Diagnosis not present

## 2022-07-03 DIAGNOSIS — I10 Essential (primary) hypertension: Secondary | ICD-10-CM | POA: Diagnosis not present

## 2022-07-03 DIAGNOSIS — D696 Thrombocytopenia, unspecified: Secondary | ICD-10-CM | POA: Diagnosis not present

## 2022-07-03 DIAGNOSIS — E7800 Pure hypercholesterolemia, unspecified: Secondary | ICD-10-CM | POA: Diagnosis not present

## 2022-07-03 DIAGNOSIS — E291 Testicular hypofunction: Secondary | ICD-10-CM | POA: Diagnosis not present

## 2022-07-22 DIAGNOSIS — M7042 Prepatellar bursitis, left knee: Secondary | ICD-10-CM | POA: Diagnosis not present

## 2023-01-22 DIAGNOSIS — L812 Freckles: Secondary | ICD-10-CM | POA: Diagnosis not present

## 2023-01-22 DIAGNOSIS — L821 Other seborrheic keratosis: Secondary | ICD-10-CM | POA: Diagnosis not present

## 2023-01-22 DIAGNOSIS — D1801 Hemangioma of skin and subcutaneous tissue: Secondary | ICD-10-CM | POA: Diagnosis not present

## 2023-02-03 DIAGNOSIS — R7303 Prediabetes: Secondary | ICD-10-CM | POA: Diagnosis not present

## 2023-02-03 DIAGNOSIS — E7800 Pure hypercholesterolemia, unspecified: Secondary | ICD-10-CM | POA: Diagnosis not present

## 2023-02-03 DIAGNOSIS — Z125 Encounter for screening for malignant neoplasm of prostate: Secondary | ICD-10-CM | POA: Diagnosis not present

## 2023-02-03 DIAGNOSIS — Z0000 Encounter for general adult medical examination without abnormal findings: Secondary | ICD-10-CM | POA: Diagnosis not present

## 2023-02-03 DIAGNOSIS — I10 Essential (primary) hypertension: Secondary | ICD-10-CM | POA: Diagnosis not present

## 2023-02-03 DIAGNOSIS — E291 Testicular hypofunction: Secondary | ICD-10-CM | POA: Diagnosis not present

## 2023-02-06 DIAGNOSIS — D7589 Other specified diseases of blood and blood-forming organs: Secondary | ICD-10-CM | POA: Diagnosis not present

## 2023-02-06 DIAGNOSIS — Z23 Encounter for immunization: Secondary | ICD-10-CM | POA: Diagnosis not present

## 2023-02-06 DIAGNOSIS — Z79899 Other long term (current) drug therapy: Secondary | ICD-10-CM | POA: Diagnosis not present

## 2023-02-06 DIAGNOSIS — Z0000 Encounter for general adult medical examination without abnormal findings: Secondary | ICD-10-CM | POA: Diagnosis not present

## 2023-04-19 DIAGNOSIS — M25511 Pain in right shoulder: Secondary | ICD-10-CM | POA: Diagnosis not present

## 2023-05-08 DIAGNOSIS — M7541 Impingement syndrome of right shoulder: Secondary | ICD-10-CM | POA: Diagnosis not present

## 2023-05-29 DIAGNOSIS — Z833 Family history of diabetes mellitus: Secondary | ICD-10-CM | POA: Diagnosis not present

## 2023-05-29 DIAGNOSIS — E785 Hyperlipidemia, unspecified: Secondary | ICD-10-CM | POA: Diagnosis not present

## 2023-05-29 DIAGNOSIS — Z8249 Family history of ischemic heart disease and other diseases of the circulatory system: Secondary | ICD-10-CM | POA: Diagnosis not present

## 2023-05-29 DIAGNOSIS — I10 Essential (primary) hypertension: Secondary | ICD-10-CM | POA: Diagnosis not present

## 2023-05-29 DIAGNOSIS — Z7984 Long term (current) use of oral hypoglycemic drugs: Secondary | ICD-10-CM | POA: Diagnosis not present

## 2023-05-29 DIAGNOSIS — Z809 Family history of malignant neoplasm, unspecified: Secondary | ICD-10-CM | POA: Diagnosis not present

## 2023-05-29 DIAGNOSIS — Z87891 Personal history of nicotine dependence: Secondary | ICD-10-CM | POA: Diagnosis not present

## 2023-05-29 DIAGNOSIS — E119 Type 2 diabetes mellitus without complications: Secondary | ICD-10-CM | POA: Diagnosis not present

## 2023-06-20 DIAGNOSIS — M25562 Pain in left knee: Secondary | ICD-10-CM | POA: Diagnosis not present

## 2023-06-25 DIAGNOSIS — E119 Type 2 diabetes mellitus without complications: Secondary | ICD-10-CM | POA: Diagnosis not present

## 2023-06-25 DIAGNOSIS — H25813 Combined forms of age-related cataract, bilateral: Secondary | ICD-10-CM | POA: Diagnosis not present

## 2023-06-25 DIAGNOSIS — H35361 Drusen (degenerative) of macula, right eye: Secondary | ICD-10-CM | POA: Diagnosis not present

## 2023-06-25 DIAGNOSIS — H47323 Drusen of optic disc, bilateral: Secondary | ICD-10-CM | POA: Diagnosis not present

## 2023-07-13 DIAGNOSIS — M25562 Pain in left knee: Secondary | ICD-10-CM | POA: Diagnosis not present

## 2023-07-21 DIAGNOSIS — M1712 Unilateral primary osteoarthritis, left knee: Secondary | ICD-10-CM | POA: Diagnosis not present

## 2023-12-29 ENCOUNTER — Other Ambulatory Visit (HOSPITAL_BASED_OUTPATIENT_CLINIC_OR_DEPARTMENT_OTHER): Payer: Self-pay | Admitting: Physician Assistant

## 2023-12-29 ENCOUNTER — Ambulatory Visit (HOSPITAL_BASED_OUTPATIENT_CLINIC_OR_DEPARTMENT_OTHER)
Admission: RE | Admit: 2023-12-29 | Discharge: 2023-12-29 | Disposition: A | Discharge status: Home / Self Care | Source: Ambulatory Visit | Attending: Physician Assistant | Admitting: Physician Assistant

## 2023-12-29 DIAGNOSIS — N50812 Left testicular pain: Secondary | ICD-10-CM

## 2023-12-29 DIAGNOSIS — N433 Hydrocele, unspecified: Secondary | ICD-10-CM | POA: Diagnosis not present

## 2023-12-29 DIAGNOSIS — N451 Epididymitis: Secondary | ICD-10-CM | POA: Diagnosis not present

## 2024-01-09 DIAGNOSIS — N433 Hydrocele, unspecified: Secondary | ICD-10-CM | POA: Diagnosis not present

## 2024-02-05 DIAGNOSIS — L812 Freckles: Secondary | ICD-10-CM | POA: Diagnosis not present

## 2024-02-05 DIAGNOSIS — L821 Other seborrheic keratosis: Secondary | ICD-10-CM | POA: Diagnosis not present

## 2024-02-05 DIAGNOSIS — C44319 Basal cell carcinoma of skin of other parts of face: Secondary | ICD-10-CM | POA: Diagnosis not present

## 2024-02-05 DIAGNOSIS — D1801 Hemangioma of skin and subcutaneous tissue: Secondary | ICD-10-CM | POA: Diagnosis not present

## 2024-02-05 DIAGNOSIS — D485 Neoplasm of uncertain behavior of skin: Secondary | ICD-10-CM | POA: Diagnosis not present

## 2024-02-16 DIAGNOSIS — Z87442 Personal history of urinary calculi: Secondary | ICD-10-CM | POA: Diagnosis not present

## 2024-03-02 DIAGNOSIS — Z125 Encounter for screening for malignant neoplasm of prostate: Secondary | ICD-10-CM | POA: Diagnosis not present

## 2024-03-02 DIAGNOSIS — E1169 Type 2 diabetes mellitus with other specified complication: Secondary | ICD-10-CM | POA: Diagnosis not present

## 2024-03-02 DIAGNOSIS — D7589 Other specified diseases of blood and blood-forming organs: Secondary | ICD-10-CM | POA: Diagnosis not present

## 2024-03-02 DIAGNOSIS — I10 Essential (primary) hypertension: Secondary | ICD-10-CM | POA: Diagnosis not present

## 2024-03-02 DIAGNOSIS — D696 Thrombocytopenia, unspecified: Secondary | ICD-10-CM | POA: Diagnosis not present

## 2024-03-02 DIAGNOSIS — E7800 Pure hypercholesterolemia, unspecified: Secondary | ICD-10-CM | POA: Diagnosis not present

## 2024-03-02 DIAGNOSIS — Z0000 Encounter for general adult medical examination without abnormal findings: Secondary | ICD-10-CM | POA: Diagnosis not present

## 2024-03-02 DIAGNOSIS — E291 Testicular hypofunction: Secondary | ICD-10-CM | POA: Diagnosis not present

## 2024-03-04 DIAGNOSIS — C44319 Basal cell carcinoma of skin of other parts of face: Secondary | ICD-10-CM | POA: Diagnosis not present
